# Patient Record
Sex: Male | Born: 1965 | Race: White | Hispanic: No | Smoking: Never smoker
Health system: Southern US, Community
[De-identification: ages and names within clinical notes are randomized; demographics above are authoritative.]

## PROBLEM LIST (undated history)

## (undated) DIAGNOSIS — E119 Type 2 diabetes mellitus without complications: Secondary | ICD-10-CM

## (undated) DIAGNOSIS — I671 Cerebral aneurysm, nonruptured: Secondary | ICD-10-CM

## (undated) DIAGNOSIS — G43909 Migraine, unspecified, not intractable, without status migrainosus: Secondary | ICD-10-CM

## (undated) DIAGNOSIS — R569 Unspecified convulsions: Secondary | ICD-10-CM

## (undated) DIAGNOSIS — I10 Essential (primary) hypertension: Secondary | ICD-10-CM

## (undated) HISTORY — PX: PANCREATIC PSEUDOCYST DRAINAGE: SHX2158

## (undated) HISTORY — PX: CHOLECYSTECTOMY: SHX55

## (undated) HISTORY — PX: BRAIN SURGERY: SHX531

---

## 1993-02-05 DIAGNOSIS — I671 Cerebral aneurysm, nonruptured: Secondary | ICD-10-CM

## 1993-02-05 HISTORY — DX: Cerebral aneurysm, nonruptured: I67.1

## 2008-04-01 ENCOUNTER — Ambulatory Visit: Payer: Self-pay | Admitting: Unknown Physician Specialty

## 2008-05-20 ENCOUNTER — Ambulatory Visit: Payer: Self-pay | Admitting: Unknown Physician Specialty

## 2009-09-27 ENCOUNTER — Emergency Department: Payer: Self-pay | Admitting: Internal Medicine

## 2009-10-23 ENCOUNTER — Emergency Department: Payer: Self-pay | Admitting: Unknown Physician Specialty

## 2010-03-31 ENCOUNTER — Ambulatory Visit: Payer: Self-pay | Admitting: Unknown Physician Specialty

## 2012-06-11 ENCOUNTER — Emergency Department: Payer: Self-pay | Admitting: Unknown Physician Specialty

## 2012-07-17 ENCOUNTER — Ambulatory Visit: Payer: Self-pay | Admitting: Physical Medicine and Rehabilitation

## 2013-09-09 ENCOUNTER — Emergency Department: Payer: Self-pay | Admitting: Emergency Medicine

## 2013-09-09 LAB — CBC
HCT: 43.9 % (ref 40.0–52.0)
HGB: 14.5 g/dL (ref 13.0–18.0)
MCH: 28.7 pg (ref 26.0–34.0)
MCHC: 33.1 g/dL (ref 32.0–36.0)
MCV: 87 fL (ref 80–100)
PLATELETS: 234 10*3/uL (ref 150–440)
RBC: 5.05 10*6/uL (ref 4.40–5.90)
RDW: 13.4 % (ref 11.5–14.5)
WBC: 7.7 10*3/uL (ref 3.8–10.6)

## 2013-09-09 LAB — URINALYSIS, COMPLETE
BILIRUBIN, UR: NEGATIVE
Bacteria: NONE SEEN
Blood: NEGATIVE
Ketone: NEGATIVE
Leukocyte Esterase: NEGATIVE
Nitrite: NEGATIVE
PH: 6 (ref 4.5–8.0)
Protein: NEGATIVE
RBC,UR: NONE SEEN /HPF (ref 0–5)
Specific Gravity: 1.038 (ref 1.003–1.030)
Squamous Epithelial: NONE SEEN
WBC UR: <1 /HPF (ref 0–5)

## 2013-09-09 LAB — COMPREHENSIVE METABOLIC PANEL
ALBUMIN: 3.8 g/dL (ref 3.4–5.0)
ALT: 37 U/L
Alkaline Phosphatase: 65 U/L
Anion Gap: 8 (ref 7–16)
BILIRUBIN TOTAL: 1.2 mg/dL — AB (ref 0.2–1.0)
BUN: 15 mg/dL (ref 7–18)
Calcium, Total: 9.2 mg/dL (ref 8.5–10.1)
Chloride: 99 mmol/L (ref 98–107)
Co2: 29 mmol/L (ref 21–32)
Creatinine: 0.73 mg/dL (ref 0.60–1.30)
EGFR (Non-African Amer.): >60
Glucose: 440 mg/dL — ABNORMAL HIGH (ref 65–99)
Osmolality: 292 (ref 275–301)
Potassium: 4.2 mmol/L (ref 3.5–5.1)
SGOT(AST): 19 U/L (ref 15–37)
Sodium: 136 mmol/L (ref 136–145)
Total Protein: 6.6 g/dL (ref 6.4–8.2)

## 2013-09-09 LAB — TROPONIN I: Troponin-I: <0.02 ng/mL

## 2013-10-06 ENCOUNTER — Ambulatory Visit: Payer: Self-pay | Admitting: Nurse Practitioner

## 2013-10-20 ENCOUNTER — Emergency Department: Payer: Self-pay | Admitting: Emergency Medicine

## 2013-11-05 ENCOUNTER — Ambulatory Visit: Payer: Self-pay | Admitting: Nurse Practitioner

## 2013-11-08 ENCOUNTER — Emergency Department: Payer: Self-pay | Admitting: Internal Medicine

## 2014-11-26 ENCOUNTER — Emergency Department: Payer: Medicare Other

## 2014-11-26 ENCOUNTER — Encounter: Payer: Self-pay | Admitting: Emergency Medicine

## 2014-11-26 ENCOUNTER — Emergency Department
Admission: EM | Admit: 2014-11-26 | Discharge: 2014-11-26 | Disposition: A | Discharge status: Home / Self Care | Payer: Medicare Other | Attending: Emergency Medicine | Admitting: Emergency Medicine

## 2014-11-26 DIAGNOSIS — Y9301 Activity, walking, marching and hiking: Secondary | ICD-10-CM | POA: Diagnosis not present

## 2014-11-26 DIAGNOSIS — S300XXA Contusion of lower back and pelvis, initial encounter: Secondary | ICD-10-CM | POA: Diagnosis not present

## 2014-11-26 DIAGNOSIS — E119 Type 2 diabetes mellitus without complications: Secondary | ICD-10-CM | POA: Diagnosis not present

## 2014-11-26 DIAGNOSIS — Y998 Other external cause status: Secondary | ICD-10-CM | POA: Diagnosis not present

## 2014-11-26 DIAGNOSIS — S6991XA Unspecified injury of right wrist, hand and finger(s), initial encounter: Secondary | ICD-10-CM | POA: Diagnosis present

## 2014-11-26 DIAGNOSIS — W19XXXA Unspecified fall, initial encounter: Secondary | ICD-10-CM

## 2014-11-26 DIAGNOSIS — W01198A Fall on same level from slipping, tripping and stumbling with subsequent striking against other object, initial encounter: Secondary | ICD-10-CM | POA: Insufficient documentation

## 2014-11-26 DIAGNOSIS — Y92009 Unspecified place in unspecified non-institutional (private) residence as the place of occurrence of the external cause: Secondary | ICD-10-CM | POA: Insufficient documentation

## 2014-11-26 DIAGNOSIS — S60221A Contusion of right hand, initial encounter: Secondary | ICD-10-CM | POA: Insufficient documentation

## 2014-11-26 HISTORY — DX: Type 2 diabetes mellitus without complications: E11.9

## 2014-11-26 NOTE — ED Provider Notes (Signed)
[facility] Emergency Department Provider Note ____________________________________________  Time seen: 1549  I have reviewed the triage vital signs and the nursing notes.  HISTORY  Chief Complaint  Fall  HPI Charles Bryan is a 49 y.o. male reports to the ED for evaluation of injury sustained after fall at home today. He describes he was walking down the driveway, when he accidentally tripped, landing on his right side with outstretched right hand. He complains of pain to the right gluteal region and in the palm of his right hand. He denies any other injury related to his fall.He rates his pain at a 7/10 in triage.  Past Medical History  Diagnosis Date  . Diabetes mellitus without complication (HCC)     There are no active problems to display for this patient.   History reviewed. No pertinent past surgical history.  No current outpatient prescriptions on file.  Allergies Review of patient's allergies indicates no known allergies.  No family history on file.  Social History Social History  Substance Use Topics  . Smoking status: Never Smoker   . Smokeless tobacco: None  . Alcohol Use: No   Review of Systems  Constitutional: Negative for fever. Eyes: Negative for visual changes. ENT: Negative for sore throat. Cardiovascular: Negative for chest pain. Respiratory: Negative for shortness of breath. Gastrointestinal: Negative for abdominal pain, vomiting and diarrhea. Genitourinary: Negative for dysuria. Musculoskeletal: Negative for back pain. Right palm pain. Right buttock pain. Skin: Negative for rash. Neurological: Negative for headaches, focal weakness or numbness. ____________________________________________  PHYSICAL EXAM:  VITAL SIGNS: ED Triage Vitals  Enc Vitals Group     BP 11/26/14 1454 149/81 mmHg     Pulse Rate 11/26/14 1454 59     Resp 11/26/14 1454 18     Temp 11/26/14 1454 97.5 F (36.4 C)     Temp Source 11/26/14  1454 Oral     SpO2 11/26/14 1454 98 %     Weight 11/26/14 1454 185 lb (83.915 kg)     Height 11/26/14 1454  (1.854 m)     Head Cir --      Peak Flow --      Pain Score 11/26/14 1454 8     Pain Loc --      Pain Edu? --      Excl. in GC? --    Constitutional: Alert and oriented. Well appearing and in no distress. Head: Normocephalic and atraumatic.      Eyes: Conjunctivae are normal. PERRL. Normal extraocular movements      Ears: Canals clear. TMs intact bilaterally.   Nose: No congestion/rhinorrhea.   Mouth/Throat: Mucous membranes are moist.   Neck: Supple. No thyromegaly. Hematological/Lymphatic/Immunological: No cervical lymphadenopathy. Cardiovascular: Normal rate, regular rhythm.  Respiratory: Normal respiratory effort. No wheezes/rales/rhonchi. Gastrointestinal: Soft and nontender. No distention. Musculoskeletal:  Right hand without any obvious deformity, abrasion, or swelling. He is mainly tender to palpation over the thenar prominence in the palm. Otherwise noted to have a normal composite fist on exam. His lumbar spine is in normal alignment without midline tenderness, spasm, or step-off. He is noted to have normal full range of motion lumbar spine. The right hip also ranges normally with flexion and extension as well as internal and external rotation. Patient is oriented to palpation over the right gluteal muscular region.Nontender with normal range of motion in all extremities.  Neurologic:  Normal gait without ataxia. Normal speech and language. No gross focal neurologic deficits are appreciated. Skin:  Skin is warm, dry and intact. No rash noted. Psychiatric: Mood and affect are normal. Patient exhibits appropriate insight and judgment. ____________________________________________   RADIOLOGY Right Hand IMPRESSION: No acute osseous injury of the right hand.  I, Nylan Nakatani, Charlesetta IvoryJenise V Bacon, personally viewed and evaluated these images (plain radiographs) as part  of my medical decision making.  ____________________________________________  INITIAL IMPRESSION / ASSESSMENT AND PLAN / ED COURSE  Normal radiology exam and normal physical exam following a ground-level fall today. No acute injury sustained to the palm or the hip. Patient will be discharged with instructions on hand contusion and buttock contusion management. He was also over-the-counter Advil or Aleve as needed for pain relief. Follow up with his primary care provider for ongoing symptoms. ____________________________________________  FINAL CLINICAL IMPRESSION(S) / ED DIAGNOSES  Final diagnoses:  Fall, initial encounter  Hand contusion, right, initial encounter  Contusion, buttock, initial encounter      Lissa HoardJenise V Bacon Grant Swager, PA-C 11/26/14 1724  Phineas SemenGraydon Goodman, MD 11/26/14 1749

## 2014-11-26 NOTE — Discharge Instructions (Signed)
Hand Contusion  A hand contusion is a deep bruise to the hand. Contusions happen when an injury causes bleeding under the skin. Signs of bruising include pain, puffiness (swelling), and discolored skin. The contusion may turn blue, purple, or yellow. HOME CARE  Put ice on the injured area.  Put ice in a plastic bag.  Place a towel between your skin and the bag.  Leave the ice on for 15-20 minutes, 03-04 times a day.  Only take medicines as told by your doctor.  Use an elastic wrap only as told. You may remove the wrap for sleeping, showering, and bathing. Take the wrap off if you lose feeling (have numbness) in your fingers, or they turn blue or cold. Put the wrap on more loosely.  Keep the hand raised (elevated) with pillows.  Avoid using your hand too much if it is painful. GET HELP RIGHT AWAY IF:   You have more redness, puffiness, or pain in your hand.  Your puffiness or pain does not get better with medicine.  You lose feeling in your hand, or you cannot move your fingers.  Your hand turns cold or blue.  You have pain when you move your fingers.  Your hand feels warm.  Your contusion does not get better in 2 days. MAKE SURE YOU:   Understand these instructions.  Will watch this condition.  Will get help right away if you are not doing well or you get worse.   This information is not intended to replace advice given to you by your health care provider. Make sure you discuss any questions you have with your health care provider.   Document Released: 07/11/2007 Document Revised: 02/12/2014 Document Reviewed: 07/16/2011 Elsevier Interactive Patient Education 2016 Else[employer] Inc.  Iliac Crest Contusion  An iliac crest contusion is a deep bruise of your hip bone (hip pointer). Contusions happen when an injury causes bleeding under the skin. Signs of bruising include pain, puffiness (swelling), and discolored skin. The contusion may turn blue, purple, or yellow. HOME  CARE   Put ice on the injured area.  Put ice in a plastic bag.  Place a towel between your skin and the bag.  Leave the ice on for 15-20 minutes, 03-04 times a day.  Only take medicines as told by your doctor.  Keep your leg straight (extended) when possible.  Walk and move around as pain allows, or as told by your doctor. Use crutches if you are told to do so.  Put on an elastic wrap as told by your doctor. You can take it off for sleeping, showers, and baths. GET HELP RIGHT AWAY IF:  You have more bruising or puffiness.  You have pain that is getting worse.  Your puffiness or pain is not helped by medicines.  Your toes get cold. MAKE SURE YOU:   Understand these instructions.  Will watch your condition.  Will get help right away if you are not doing well or get worse.   This information is not intended to replace advice given to you by your health care provider. Make sure you discuss any questions you have with your health care provider.   Document Released: 01/11/2011 Document Revised: 07/24/2011 Document Reviewed: 06/09/2014 Elsevier Interactive Patient Education [employer]  Your exam and x-ray are normal today following your fall. Take OTC Aleve or Advil as needed for pain relief.  Apply ice to reduce pain and swelling.

## 2014-11-26 NOTE — ED Notes (Signed)
Pt to ED with c/o fall, states he tripped and fell and landed on right hip, c/o right hip pain and right hand pain

## 2014-11-30 ENCOUNTER — Encounter: Payer: Self-pay | Admitting: Emergency Medicine

## 2014-11-30 ENCOUNTER — Emergency Department
Admission: EM | Admit: 2014-11-30 | Discharge: 2014-11-30 | Disposition: A | Discharge status: Home / Self Care | Payer: Medicare Other | Attending: Student | Admitting: Student

## 2014-11-30 DIAGNOSIS — R200 Anesthesia of skin: Secondary | ICD-10-CM | POA: Diagnosis not present

## 2014-11-30 DIAGNOSIS — E1165 Type 2 diabetes mellitus with hyperglycemia: Secondary | ICD-10-CM | POA: Diagnosis not present

## 2014-11-30 DIAGNOSIS — E119 Type 2 diabetes mellitus without complications: Secondary | ICD-10-CM

## 2014-11-30 LAB — GLUCOSE, CAPILLARY: Glucose-Capillary: 168 mg/dL — ABNORMAL HIGH (ref 65–99)

## 2014-11-30 NOTE — ED Notes (Signed)
Pt to ed with c/o elevated blood sugar today,  States it was approx 500, states last 2 months increasing.

## 2014-11-30 NOTE — Discharge Instructions (Signed)
Follow-up with her primary care doctor in 1-2 days for reevaluation. Your blood pressure is a little bit elevated here in the emergency department. You need to have this rechecked by your primary care doctor as well within the next week. Return to the emergency department if you develop any chest pain, difficulty breathing, sudden sweating, or numbness, new weakness, or for any other concerns.

## 2014-11-30 NOTE — ED Provider Notes (Signed)
[facility] Emergency Department Provider Note  ____________________________________________  Time seen: Approximately 7:35 AM  I have reviewed the triage vital signs and the nursing notes.   HISTORY  Chief Complaint Hyperglycemia    HPI Charles Bryan is a 49 y.o. 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 Patient reports that yesterday he took his blood sugar and it was normal. Over the past 1-3 months that has been good range averaging under 150. Today when he checked his blood sugar it was greater than 500 on his home meter. He has had no chest pain, difficulty breathing, recent illness including no cough, sneezing, runny nose, congestion, vomiting, diarrhea, fevers or chills. No chest pain or difficulty breathing. No recent change in his medications. He has otherwise been in his usual state of health. No modifying factors. Currently he has no symptoms. He reports compliance with all of his medications.   Past Medical History  Diagnosis Date  . Diabetes mellitus without complication (HCC)     There are no active problems to display for this patient.   History reviewed. No pertinent past surgical history.  No current outpatient prescriptions on file.  Allergies Review of patient's allergies indicates no known allergies.  History reviewed. No pertinent family history.  Social History Social History  Substance Use Topics  . Smoking status: Never Smoker   . Smokeless tobacco: None  . Alcohol Use: No    Review of Systems Constitutional: No fever/chills Eyes: No visual changes. ENT: No sore throat. Cardiovascular: Denies chest pain. Respiratory: Denies shortness of breath. Gastrointestinal: No abdominal pain.  No nausea, no vomiting.  No diarrhea.  No  constipation. Genitourinary: Negative for dysuria. Musculoskeletal: Negative for back pain. Skin: Negative for rash. Neurological: Negative for headaches, positive for chronic LEU and LLE focal weakness and numbness.  10-point ROS otherwise negative.  ____________________________________________   PHYSICAL EXAM:  VITAL SIGNS: ED Triage Vitals  Enc Vitals Group     BP 11/30/14 0728 171/92 mmHg     Pulse Rate 11/30/14 0728 59     Resp 11/30/14 0728 20     Temp --      Temp Source 11/30/14 0728 Oral     SpO2 11/30/14 0728 98 %     Weight 11/30/14 0728 185 lb (83.915 kg)     Height 11/30/14 0728 6\' 1"  (1.854 m)     Head Cir --      Peak Flow --      Pain Score 11/30/14 0729 0     Pain Loc --      Pain Edu? --      Excl. in GC? --     Constitutional: Alert and oriented. Well appearing and in no acute distress. Eyes: Conjunctivae are normal. PERRL. EOMI. Head: Atraumatic. Nose: No congestion/rhinnorhea. Mouth/Throat: Mucous membranes are moist.  Oropharynx non-erythematous. Neck: No stridor.   Cardiovascular: Normal rate, regular rhythm. Grossly normal heart sounds.  Good peripheral circulation. Respiratory: Normal respiratory effort.  No retractions. Lungs CTAB. Gastrointestinal: Soft and nontender. No distention. No abdominal bruits. No CVA tenderness. Genitourinary: deferred Musculoskeletal: No lower extremity tenderness nor edema.  No joint effusions. Neurologic:  Normal speech and language. Chronic mild weakness in the left upper extremity and left lower extremity which the patient reports is chronic for the past 20  years. Skin:  Skin is warm, dry and intact. No rash noted. Psychiatric: Mood and affect are normal. Speech and behavior are normal.  ____________________________________________   LABS (all labs ordered are listed, but only abnormal results are displayed)  Labs Reviewed  GLUCOSE, CAPILLARY - Abnormal; Notable for the following:    Glucose-Capillary 168  (*)    All other components within normal limits  CBG MONITORING, ED   ____________________________________________  EKG  none ____________________________________________  RADIOLOGY  none ____________________________________________   PROCEDURES  Procedure(s) performed: None  Critical Care performed: No  ____________________________________________   INITIAL IMPRESSION / ASSESSMENT AND PLAN / ED COURSE  Pertinent labs & imaging results that were available during my care of the patient were reviewed by me and considered in my medical decision making (see chart for details).  Charles Bryan is a 49 y.o. 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 He is asymptomatic. On exam he is very well-appearing and in no acute distress. Vital signs stable with the exception of mild to moderate hypertension. Glucose is 168. Patient reports that he thinks his meter is running low on battery. I have encouraged him to report to his pharmacy so that it can be checked/calibrated or replaced. Additionally he will follow up with his primary care doctor in the next 2-3 days for re-check and for Blood pressure re-check. We discussed return precautions and he is comfortable with the discharge plan. DC home. ____________________________________________   FINAL CLINICAL IMPRESSION(S) / ED DIAGNOSES  Final diagnoses:  Type 2 diabetes mellitus without complication, without long-term current use of insulin (HCC)      Gayla Doss, MD 11/30/14 [phone]

## 2015-10-26 ENCOUNTER — Emergency Department
Admission: EM | Admit: 2015-10-26 | Discharge: 2015-10-26 | Disposition: A | Discharge status: Home / Self Care | Payer: Medicare Other | Attending: Emergency Medicine | Admitting: Emergency Medicine

## 2015-10-26 ENCOUNTER — Encounter: Payer: Self-pay | Admitting: Emergency Medicine

## 2015-10-26 ENCOUNTER — Emergency Department: Payer: Medicare Other

## 2015-10-26 DIAGNOSIS — Z23 Encounter for immunization: Secondary | ICD-10-CM | POA: Insufficient documentation

## 2015-10-26 DIAGNOSIS — Z7982 Long term (current) use of aspirin: Secondary | ICD-10-CM | POA: Insufficient documentation

## 2015-10-26 DIAGNOSIS — E119 Type 2 diabetes mellitus without complications: Secondary | ICD-10-CM | POA: Insufficient documentation

## 2015-10-26 DIAGNOSIS — Y999 Unspecified external cause status: Secondary | ICD-10-CM | POA: Diagnosis not present

## 2015-10-26 DIAGNOSIS — S91122A Laceration with foreign body of left great toe without damage to nail, initial encounter: Secondary | ICD-10-CM | POA: Diagnosis present

## 2015-10-26 DIAGNOSIS — Z79899 Other long term (current) drug therapy: Secondary | ICD-10-CM | POA: Insufficient documentation

## 2015-10-26 DIAGNOSIS — Y929 Unspecified place or not applicable: Secondary | ICD-10-CM | POA: Insufficient documentation

## 2015-10-26 DIAGNOSIS — Y9389 Activity, other specified: Secondary | ICD-10-CM | POA: Diagnosis not present

## 2015-10-26 DIAGNOSIS — W25XXXA Contact with sharp glass, initial encounter: Secondary | ICD-10-CM | POA: Diagnosis not present

## 2015-10-26 DIAGNOSIS — S90452A Superficial foreign body, left great toe, initial encounter: Secondary | ICD-10-CM | POA: Diagnosis not present

## 2015-10-26 DIAGNOSIS — Z7984 Long term (current) use of oral hypoglycemic drugs: Secondary | ICD-10-CM | POA: Insufficient documentation

## 2015-10-26 MED ORDER — CEPHALEXIN 500 MG PO CAPS: 500.0000 mg | ORAL_CAPSULE | Freq: Three times a day (TID) | ORAL | 0 refills | Status: DC

## 2015-10-26 MED ORDER — TETANUS-DIPHTH-ACELL PERTUSSIS 5-2.5-18.5 LF-MCG/0.5 IM SUSP
0.5000 mL | Freq: Once | INTRAMUSCULAR | Status: AC
Start: 2015-10-26 — End: 2015-10-26
  Administered 2015-10-26: 0.5 mL via INTRAMUSCULAR
  Filled 2015-10-26: qty 0.5

## 2015-10-26 NOTE — ED Notes (Addendum)
Pt states he got glass in his left foot on Sunday and wants to be evaluated due to being a diabetic, pt awake and alert, ambulatory

## 2015-10-26 NOTE — ED Provider Notes (Signed)
[facility] Emergency Department Provider Note  ____________________________________________  Time seen: Approximately 2:12 PM  I have reviewed the triage vital signs and the nursing notes.   HISTORY  Chief Complaint Toe Pain    HPI Charles Bryan is a 50 y.o. male, NAD, presents to the emergency department with 3 day history of pain and laceration of the left great toe.  Patient reports that the cut appeared on Sunday and he believes he may have stepped on a shard of glass. Patient is unsure if the glass is still in the toe.  He states that he has been cleaning the wound well and applying Neosporin and bandages.  He endorses pain of the plantar surface of the left great toe.  He also reports a soreness of the left medial ankle and medial ball of left foot that began 2 days ago, but believes this may be due to planting his left foot more medially when walking to minimize pain about the toe.  He expresses concern for possible infection as he is diabetic and history of chronic decreased sensation of the left side of his body due to remote aneurism. Patient states that his most recent A1C was 6.1% and that his glucose typically ranges between 100 and 130.  Patient denies redness, swelling or drainage at the wound.  He denies loss of ROM of left toes and ankle.  Patient denies headache, fever, chills, abdominal pain, chest pain, shortness of breath, nausea, or vomiting.    Past Medical History:  Diagnosis Date  . Diabetes mellitus without complication (HCC)     There are no active problems to display for this patient.   History reviewed. No pertinent surgical history.  Prior to Admission medications   Medication Sig Start Date End Date Taking? Authorizing Provider  aspirin EC 81 MG tablet Take 81 mg by mouth daily.    Historical Provider, MD  cephALEXin (KEFLEX) 500 MG capsule Take 1 capsule (500 mg total) by mouth 3 (three) times daily. 10/26/15   Daylan Juhnke L Kaula Klenke,  PA-C  gabapentin (NEURONTIN) 800 MG tablet Take 800 mg by mouth 4 (four) times daily.    Historical Provider, MD  Lacosamide 150 MG TABS Take 150 mg by mouth 2 (two) times daily.    Historical Provider, MD  levETIRAcetam (KEPPRA XR) 500 MG 24 hr tablet Take 500 mg by mouth 2 (two) times daily.    Historical Provider, MD  metFORMIN (GLUCOPHAGE) 500 MG tablet Take 1,000 mg by mouth 2 (two) times daily with a meal.    Historical Provider, MD  Multiple Vitamin (MULTI-VITAMINS) TABS Take 1 tablet by mouth daily.    Historical Provider, MD  propranolol (INDERAL) 80 MG tablet Take 80 mg by mouth 2 (two) times daily.    Historical Provider, MD  sitaGLIPtin (JANUVIA) 100 MG tablet Take 100 mg by mouth daily.    Historical Provider, MD  SUMAtriptan (IMITREX) 100 MG tablet Take 100 mg by mouth every 2 (two) hours as needed for migraine.     Historical Provider, MD  tiZANidine (ZANAFLEX) 4 MG tablet Take 2-8 mg by mouth See admin instructions. 2 mg every morning and 8 mg at bedtime    Historical Provider, MD  zolpidem (AMBIEN) 10 MG tablet Take 10 mg by mouth at bedtime as needed for sleep.     Historical Provider, MD    Allergies Review of patient's allergies indicates no known allergies.  No family history on file.  Social History Social  History  Substance Use Topics  . Smoking status: Never Smoker  . Smokeless tobacco: Never Used  . Alcohol use No     Review of Systems  Constitutional: No fever/chills Cardiovascular: No chest pain. Respiratory: No shortness of breath.  Gastrointestinal: No abdominal pain.  No nausea, vomiting.   Musculoskeletal: Positive left great toe, foot and ankle pain. No lower leg pain.  Skin: Positive laceration to left toe, Negative for rash, redness, swelling, oozing, weeping.   Neurological: Negative for headaches. Chronic loss of sensation of left side of body 10-point ROS otherwise negative.  ____________________________________________   PHYSICAL  EXAM:  VITAL SIGNS: ED Triage Vitals  Enc Vitals Group     BP 10/26/15 1145 (!) 153/73     Pulse Rate 10/26/15 1145 67     Resp 10/26/15 1145 18     Temp 10/26/15 1145 98 F (36.7 C)     Temp Source 10/26/15 1145 Oral     SpO2 10/26/15 1145 97 %     Weight 10/26/15 1146 190 lb (86.2 kg)     Height 10/26/15 1146 6\' 1"  (1.854 m)     Head Circumference --      Peak Flow --      Pain Score 10/26/15 1146 5     Pain Loc --      Pain Edu? --      Excl. in GC? --      Constitutional: Alert and oriented. Well appearing and in no acute distress. Eyes: Conjunctivae are normal.  Head: Atraumatic. Cardiovascular: Good peripheral circulation with 2+ pulses in the left lower extremity. Capillary refill is brisk in all digits of the left foot.  Respiratory: Normal respiratory effort without tachypnea or retractions.  Musculoskeletal: Tenderness on palpation of plantar surface of left great toe at the laceration site. Full range of motion of all toes on the left foot. Full range of motion of the left ankle without pain or difficulty. No laxity with anterior or posterior drawer of the left ankle. No lower extremity pain or edema. No joint effusions. Neurologic:  Normal speech and language. No gross focal neurologic deficits are appreciated.  Skin:  Skin is warm, dry. No rash noted. 2mm superficial laceration on plantar surface of left great toe with mild surrounding erythema.  No oozing or weeping, no bleeding.  No foreign body palpated or visualized. Psychiatric: Mood and affect are normal. Speech and behavior are normal. Patient exhibits appropriate insight and judgement.   ____________________________________________   LABS  None ____________________________________________  EKG  None ____________________________________________  RADIOLOGY I have personally viewed and evaluated these images (plain radiographs) as part of my medical decision making, as well as reviewing the written  report by the radiologist.  Dg Toe Great Left  Result Date: 10/26/2015 CLINICAL DATA:  Stepped on broken glass, pain, diabetes EXAM: LEFT GREAT TOE COMPARISON:  None available FINDINGS: On the lateral view, there is a small radiopaque rectangular foreign body within the plantar soft tissues of the great toe compatible with a glass foreign body. This measures 2 mm. Mild soft tissue swelling. No acute osseous finding or malalignment.  Negative for fracture. IMPRESSION: Small 2 mm radiopaque foreign body in the great toe plantar soft tissues. Electronically Signed   By: Judie PetitM.  Shick M.D.   On: 10/26/2015 13:20    ____________________________________________    PROCEDURES  Procedure(s) performed: None   .Foreign Body Removal Date/Time: 10/26/2015 2:13 PM Performed by: Tye SavoyHAGLER, Fatime Biswell L Authorized by: Hope PigeonHAGLER, Adonis Ryther L  Consent: Verbal consent obtained. Risks and benefits: risks, benefits and alternatives were discussed Consent given by: patient Patient understanding: patient states understanding of the procedure being performed Imaging studies: imaging studies available Patient identity confirmed: verbally with patient and arm band Time out: Immediately prior to procedure a "time out" was called to verify the correct patient, procedure, equipment, support staff and site/side marked as required. Body area: skin General location: lower extremity Location details: left big toe Anesthesia: local infiltration  Anesthesia: Local Anesthetic: lidocaine 1% with epinephrine Anesthetic total: 2 mL  Sedation: Patient sedated: no Patient restrained: no Patient cooperative: yes Localization method: probed and serial x-rays Removal mechanism: forceps, scalpel and irrigation Dressing: dressing applied Tendon involvement: none Depth: subcutaneous Complexity: simple 1 objects recovered. Objects recovered: 3mm glass shard Post-procedure assessment: foreign body removed Patient tolerance: Patient  tolerated the procedure well with no immediate complications     Medications  Tdap (BOOSTRIX) injection 0.5 mL (0.5 mLs Intramuscular Given 10/26/15 1420)     ____________________________________________   INITIAL IMPRESSION / ASSESSMENT AND PLAN / ED COURSE  Pertinent labs & imaging results that were available during my care of the patient were reviewed by me and considered in my medical decision making (see chart for details).  Clinical Course    Patient's diagnosis is consistent with superficial foreign body of the left great toe that was subsequently removed. Patient will be discharged home with prescriptions for Keflex to take as directed. Patient is to keep the area clean and dry as well as covered until fully healed. Patient is to follow up with his primary care provider 48 hours for wound recheck. Patient is given ED precautions to return to the ED for any worsening or new symptoms.    ____________________________________________  FINAL CLINICAL IMPRESSION(S) / ED DIAGNOSES  Final diagnoses:  Superficial foreign body of left great toe, initial encounter      NEW MEDICATIONS STARTED DURING THIS VISIT:  Discharge Medication List as of 10/26/2015  2:15 PM    START taking these medications   Details  cephALEXin (KEFLEX) 500 MG capsule Take 1 capsule (500 mg total) by mouth 3 (three) times daily., Starting Wed 10/26/2015, Print             Ernestene Kiel [place], PA-C 10/26/15 1719    Jennye Moccasin, MD 10/27/15 [phone]

## 2015-10-26 NOTE — ED Triage Notes (Signed)
Pt reports piece of glass on left foot great toe since Sunday. Pain to that area

## 2015-10-26 NOTE — ED Notes (Signed)
Pt informed to return if any life threatening symptoms occur.  

## 2016-03-29 ENCOUNTER — Emergency Department
Admission: EM | Admit: 2016-03-29 | Discharge: 2016-03-29 | Disposition: A | Discharge status: Home / Self Care | Payer: Medicare PPO | Attending: Emergency Medicine | Admitting: Emergency Medicine

## 2016-03-29 ENCOUNTER — Emergency Department: Payer: Medicare PPO

## 2016-03-29 ENCOUNTER — Encounter: Payer: Self-pay | Admitting: Emergency Medicine

## 2016-03-29 DIAGNOSIS — Z79899 Other long term (current) drug therapy: Secondary | ICD-10-CM | POA: Insufficient documentation

## 2016-03-29 DIAGNOSIS — S92911A Unspecified fracture of right toe(s), initial encounter for closed fracture: Secondary | ICD-10-CM

## 2016-03-29 DIAGNOSIS — Z7984 Long term (current) use of oral hypoglycemic drugs: Secondary | ICD-10-CM | POA: Diagnosis not present

## 2016-03-29 DIAGNOSIS — Y999 Unspecified external cause status: Secondary | ICD-10-CM | POA: Insufficient documentation

## 2016-03-29 DIAGNOSIS — Y939 Activity, unspecified: Secondary | ICD-10-CM | POA: Diagnosis not present

## 2016-03-29 DIAGNOSIS — Y929 Unspecified place or not applicable: Secondary | ICD-10-CM | POA: Insufficient documentation

## 2016-03-29 DIAGNOSIS — S99921A Unspecified injury of right foot, initial encounter: Secondary | ICD-10-CM | POA: Diagnosis present

## 2016-03-29 DIAGNOSIS — Z7982 Long term (current) use of aspirin: Secondary | ICD-10-CM | POA: Insufficient documentation

## 2016-03-29 DIAGNOSIS — W208XXA Other cause of strike by thrown, projected or falling object, initial encounter: Secondary | ICD-10-CM | POA: Insufficient documentation

## 2016-03-29 DIAGNOSIS — E119 Type 2 diabetes mellitus without complications: Secondary | ICD-10-CM | POA: Insufficient documentation

## 2016-03-29 DIAGNOSIS — S92534A Nondisplaced fracture of distal phalanx of right lesser toe(s), initial encounter for closed fracture: Secondary | ICD-10-CM | POA: Diagnosis not present

## 2016-03-29 NOTE — ED Provider Notes (Signed)
[facility] Emergency Department Provider Note  ____________________________________________  Time seen: Approximately 2:06 PM  I have reviewed the triage vital signs and the nursing notes.   HISTORY  Chief Complaint Toe Injury    HPI Charles Bryan is a 51 y.o. male presents to emergency department after dropping a 20oz drink onto 4th toe.  Patient states that toe has been swelling and painful to walk on. Patient denies any other injuries. Patient has not taken anything for pain. No fever, SOB, CP, nausea, vomiting, abdominal pain.    Past Medical History:  Diagnosis Date  . Diabetes mellitus without complication (HCC)     There are no active problems to display for this patient.   History reviewed. No pertinent surgical history.  Prior to Admission medications   Medication Sig Start Date End Date Taking? Authorizing Provider  aspirin EC 81 MG tablet Take 81 mg by mouth daily.    Historical Provider, MD  cephALEXin (KEFLEX) 500 MG capsule Take 1 capsule (500 mg total) by mouth 3 (three) times daily. 10/26/15   Jami L Hagler, PA-C  gabapentin (NEURONTIN) 800 MG tablet Take 800 mg by mouth 4 (four) times daily.    Historical Provider, MD  Lacosamide 150 MG TABS Take 150 mg by mouth 2 (two) times daily.    Historical Provider, MD  levETIRAcetam (KEPPRA XR) 500 MG 24 hr tablet Take 500 mg by mouth 2 (two) times daily.    Historical Provider, MD  metFORMIN (GLUCOPHAGE) 500 MG tablet Take 1,000 mg by mouth 2 (two) times daily with a meal.    Historical Provider, MD  Multiple Vitamin (MULTI-VITAMINS) TABS Take 1 tablet by mouth daily.    Historical Provider, MD  propranolol (INDERAL) 80 MG tablet Take 80 mg by mouth 2 (two) times daily.    Historical Provider, MD  sitaGLIPtin (JANUVIA) 100 MG tablet Take 100 mg by mouth daily.    Historical Provider, MD  SUMAtriptan (IMITREX) 100 MG tablet Take 100 mg by mouth every 2 (two) hours as needed for migraine.      Historical Provider, MD  tiZANidine (ZANAFLEX) 4 MG tablet Take 2-8 mg by mouth See admin instructions. 2 mg every morning and 8 mg at bedtime    Historical Provider, MD  zolpidem (AMBIEN) 10 MG tablet Take 10 mg by mouth at bedtime as needed for sleep.     Historical Provider, MD    Allergies Patient has no known allergies.  No family history on file.  Social History Social History  Substance Use Topics  . Smoking status: Never Smoker  . Smokeless tobacco: Never Used  . Alcohol use No     Review of Systems  Constitutional: No fever/chills ENT: No upper respiratory complaints. Cardiovascular: No chest pain. Respiratory: No cough. No SOB. Gastrointestinal: No abdominal pain.  No nausea, no vomiting.  Skin: Negative for rash, abrasions, lacerations. Neurological: Negative for headaches, numbness or tingling   ____________________________________________   PHYSICAL EXAM:  VITAL SIGNS: ED Triage Vitals  Enc Vitals Group     BP 03/29/16 1323 (!) 156/97     Pulse Rate 03/29/16 1323 66     Resp 03/29/16 1323 18     Temp 03/29/16 1323 98.7 F (37.1 C)     Temp Source 03/29/16 1323 Oral     SpO2 03/29/16 1323 100 %     Weight 03/29/16 1324 195 lb (88.5 kg)     Height 03/29/16 1324 6\' 1"  (1.854 m)  Head Circumference --      Peak Flow --      Pain Score 03/29/16 1325 0     Pain Loc --      Pain Edu? --      Excl. in GC? --      Constitutional: Alert and oriented. Well appearing and in no acute distress. Eyes: Conjunctivae are normal. PERRL. EOMI. Head: Atraumatic. ENT:      Ears:      Nose: No congestion/rhinnorhea.      Mouth/Throat: Mucous membranes are moist.  Neck: No stridor.  Cardiovascular: Normal rate, regular rhythm.  Good peripheral circulation. 2+ dorsalis pedis and posterior tibialis pulses. Respiratory: Normal respiratory effort without tachypnea or retractions. Lungs CTAB. Good air entry to the bases with no decreased or absent breath  sounds. Musculoskeletal: Full range of motion to all extremities. No gross deformities appreciated.   Neurologic:  Normal speech and language. No gross focal neurologic deficits are appreciated.  Skin:  Skin is warm, dry and intact. Blood blister at distal 4th right toe. Bruising and mild swelling to distal toe.  Psychiatric: Mood and affect are normal. Speech and behavior are normal. Patient exhibits appropriate insight and judgement.   ____________________________________________   LABS (all labs ordered are listed, but only abnormal results are displayed)  Labs Reviewed - No data to display ____________________________________________  EKG   ____________________________________________  RADIOLOGY Lexine Baton, personally viewed and evaluated these images (plain radiographs) as part of my medical decision making, as well as reviewing the written report by the radiologist.  Dg Toe 4th Right  Result Date: 03/29/2016 CLINICAL DATA:  Right fourth toe injury, dropped 20 ounces drink on toe yesterday EXAM: RIGHT FOURTH TOE COMPARISON:  None. FINDINGS: Four views of the right fourth toe submitted. There is nondisplaced fracture at the tip of distal phalanx. Soft tissue swelling is noted. IMPRESSION: Nondisplaced fracture at the tip of distal phalanx fourth toe with adjacent soft tissue swelling. Electronically Signed   By: Natasha Mead M.D.   On: 03/29/2016 14:39    ____________________________________________    PROCEDURES  Procedure(s) performed:    Procedures  Blood was drained from blister as distal end of 4th toe  Medications - No data to display   ____________________________________________   INITIAL IMPRESSION / ASSESSMENT AND PLAN / ED COURSE  Pertinent labs & imaging results that were available during my care of the patient were reviewed by me and considered in my medical decision making (see chart for details).  Review of the [place] CSRS was performed in  accordance of the [facility] prior to dispensing any controlled drugs.     Patient's diagnosis is consistent with toe fracture and nail bed injury. Vital signs and exam are reassuring. Toe fracture indicated on x-ray. Toe was buddy taped. Blood was drained from blister at distal toe. Injury to nail bed is observed. Toenail will be left in place for new nail to grow under and I expect that old toenail will fall off.  Education was provided. All questions were answered. Patient is to follow up with PCP as directed. Patient is given ED precautions to return to the ED for any worsening or new symptoms.     ____________________________________________  FINAL CLINICAL IMPRESSION(S) / ED DIAGNOSES  Final diagnoses:  Closed nondisplaced fracture of phalanx of toe of right foot, unspecified toe, initial encounter      NEW MEDICATIONS STARTED DURING THIS VISIT:  Discharge Medication List as of 03/29/2016  3:20 PM  This chart was dictated using voice recognition software/Dragon. Despite best efforts to proofread, errors can occur which can change the meaning. Any change was purely unintentional.    Enid Derry, PA-C 03/29/16 1600    Sharman Cheek, MD 04/03/16 1110

## 2016-03-29 NOTE — ED Triage Notes (Signed)
States dropped a 20 oz bottle of soda on right fourth toe yesterday, today arrives c/o pain and blood blister to toe.

## 2016-03-29 NOTE — ED Notes (Signed)
Pt states dropped a 20 oz drink from the top of his counter onto his 4th toe yesterday. Pt presents with bruising and swelling under the nail and up to the distal joint from the foot. Pt ambulatory with no difficulty, c/o pain when toe is touched or moved.

## 2016-04-29 ENCOUNTER — Encounter: Payer: Self-pay | Admitting: Emergency Medicine

## 2016-04-29 ENCOUNTER — Emergency Department
Admission: EM | Admit: 2016-04-29 | Discharge: 2016-04-29 | Disposition: A | Discharge status: Home / Self Care | Payer: Medicare PPO | Attending: Emergency Medicine | Admitting: Emergency Medicine

## 2016-04-29 ENCOUNTER — Emergency Department: Payer: Medicare PPO

## 2016-04-29 DIAGNOSIS — E119 Type 2 diabetes mellitus without complications: Secondary | ICD-10-CM | POA: Insufficient documentation

## 2016-04-29 DIAGNOSIS — Z7982 Long term (current) use of aspirin: Secondary | ICD-10-CM | POA: Insufficient documentation

## 2016-04-29 DIAGNOSIS — Z7984 Long term (current) use of oral hypoglycemic drugs: Secondary | ICD-10-CM | POA: Diagnosis not present

## 2016-04-29 DIAGNOSIS — R0789 Other chest pain: Secondary | ICD-10-CM | POA: Insufficient documentation

## 2016-04-29 DIAGNOSIS — Z79899 Other long term (current) drug therapy: Secondary | ICD-10-CM | POA: Diagnosis not present

## 2016-04-29 DIAGNOSIS — R0781 Pleurodynia: Secondary | ICD-10-CM | POA: Diagnosis present

## 2016-04-29 MED ORDER — OXYCODONE-ACETAMINOPHEN 5-325 MG PO TABS
1.0000 | ORAL_TABLET | Freq: Once | ORAL | Status: AC
  Administered 2016-04-29: 1 via ORAL
  Filled 2016-04-29: qty 1

## 2016-04-29 MED ORDER — OXYCODONE-ACETAMINOPHEN 5-325 MG PO TABS: 1.0000 | ORAL_TABLET | Freq: Four times a day (QID) | ORAL | 0 refills | Status: DC | PRN

## 2016-04-29 NOTE — ED Provider Notes (Signed)
[facility] Emergency Department Provider Note   ____________________________________________   None    (approximate)  I have reviewed the triage vital signs and the nursing notes.   HISTORY  Chief Complaint Chest Pain    HPI Charles Bryan is a 51 y.o. male complaining of left rib cage pain status post slip and fall last night. Patient state increased pain with deep inspirations. Patient denies any head injury from this incident. Patient rates his pain as a 6/10. Patient described a pain as "stabbing/aching". No palliative measures for this complaint.   Past Medical History:  Diagnosis Date  . Diabetes mellitus without complication (HCC)     There are no active problems to display for this patient.   History reviewed. No pertinent surgical history.  Prior to Admission medications   Medication Sig Start Date End Date Taking? Authorizing Provider  aspirin EC 81 MG tablet Take 81 mg by mouth daily.    Historical Provider, MD  cephALEXin (KEFLEX) 500 MG capsule Take 1 capsule (500 mg total) by mouth 3 (three) times daily. 10/26/15   Jami L Hagler, PA-C  gabapentin (NEURONTIN) 800 MG tablet Take 800 mg by mouth 4 (four) times daily.    Historical Provider, MD  Lacosamide 150 MG TABS Take 150 mg by mouth 2 (two) times daily.    Historical Provider, MD  levETIRAcetam (KEPPRA XR) 500 MG 24 hr tablet Take 500 mg by mouth 2 (two) times daily.    Historical Provider, MD  metFORMIN (GLUCOPHAGE) 500 MG tablet Take 1,000 mg by mouth 2 (two) times daily with a meal.    Historical Provider, MD  Multiple Vitamin (MULTI-VITAMINS) TABS Take 1 tablet by mouth daily.    Historical Provider, MD  propranolol (INDERAL) 80 MG tablet Take 80 mg by mouth 2 (two) times daily.    Historical Provider, MD  sitaGLIPtin (JANUVIA) 100 MG tablet Take 100 mg by mouth daily.    Historical Provider, MD  SUMAtriptan (IMITREX) 100 MG tablet Take 100 mg by mouth every 2 (two) hours  as needed for migraine.     Historical Provider, MD  tiZANidine (ZANAFLEX) 4 MG tablet Take 2-8 mg by mouth See admin instructions. 2 mg every morning and 8 mg at bedtime    Historical Provider, MD  zolpidem (AMBIEN) 10 MG tablet Take 10 mg by mouth at bedtime as needed for sleep.     Historical Provider, MD    Allergies Patient has no known allergies.  History reviewed. No pertinent family history.  Social History Social History  Substance Use Topics  . Smoking status: Never Smoker  . Smokeless tobacco: Never Used  . Alcohol use No    Review of Systems Constitutional: No fever/chills Eyes: No visual changes. ENT: No sore throat. Cardiovascular: Denies chest pain. Respiratory: Denies shortness of breath. Gastrointestinal: No abdominal pain.  No nausea, no vomiting.  No diarrhea.  No constipation. Genitourinary: Negative for dysuria. Musculoskeletal:Left lateral chest rib pain Skin: Negative for rash. Neurological: Negative for headaches, focal weakness or numbness. 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  ____________________________________________   PHYSICAL EXAM:  VITAL SIGNS: ED Triage Vitals  Enc Vitals Group     BP 04/29/16 1418 (!) 180/90     Pulse Rate 04/29/16 1418 70     Resp 04/29/16 1418 20     Temp 04/29/16 1418 97.5 F (36.4 C)     Temp Source 04/29/16 1418 Oral  SpO2 04/29/16 1418 97 %     Weight 04/29/16 1419 190 lb (86.2 kg)     Height 04/29/16 1419 6\' 1"  (1.854 m)     Head Circumference --      Peak Flow --      Pain Score 04/29/16 1422 6     Pain Loc --      Pain Edu? --      Excl. in GC? --     Constitutional: Alert and oriented. Well appearing and in no acute distress. Eyes: Conjunctivae are normal. PERRL. EOMI. Head: Atraumatic. Nose: No congestion/rhinnorhea. Mouth/Throat: Mucous membranes are moist.  Oropharynx non-erythematous. Neck: No stridor.  No cervical spine tenderness to  palpation. Hematological/Lymphatic/Immunilogical: No cervical lymphadenopathy. Cardiovascular: Normal rate, regular rhythm. Grossly normal heart sounds.  Good peripheral circulation. Respiratory: Normal respiratory effort.  No retractions. Lungs CTAB. Gastrointestinal: Soft and nontender. No distention. No abdominal bruits. No CVA tenderness. Musculoskeletal:No obvious chest wall deformity. Patient equal chest wall expansion. It is noted that the patient has decreased range of motion with the left upper extremity secondary to his TBI. No lower extremity tenderness nor edema.  No joint effusions. Neurologic:  Normal speech and language. No gross focal neurologic deficits are appreciated. No gait instability. Skin:  Skin is warm, dry and intact. No rash noted. No abrasion or ecchymosis. Psychiatric: Mood and affect are normal. Speech and behavior are normal.  ____________________________________________   LABS (all labs ordered are listed, but only abnormal results are displayed)  Labs Reviewed - No data to display ____________________________________________  EKG  EKG read by heart station doctors no acute findings. ____________________________________________  RADIOLOGY  No acute findings on chest x-ray. ____________________________________________   PROCEDURES  Procedure(s) performed: None  Procedures  Critical Care performed: No  ____________________________________________   INITIAL IMPRESSION / ASSESSMENT AND PLAN / ED COURSE  Pertinent labs & imaging results that were available during my care of the patient were reviewed by me and considered in my medical decision making (see chart for details).  Chest wall contusion. Discussed neck x-ray finding with patient. Patient given discharge care instructions. Patient given 3 days a Percocets. His  bloodpressureiselevatedtodaypatientstatetakemedicationsforbloodpressure.AdvisedtodayBPcheckwithfamilydoctor.   ____________________________________________   FINAL CLINICAL IMPRESSION(S) / ED DIAGNOSES  Final diagnoses:  Chest wall pain      NEW MEDICATIONS STARTED DURING THIS VISIT:  New Prescriptions   No medications on file     Note:  This document was prepared using Dragon voice recognition software and may include unintentional dictation errors.    Joni Reining, PA-C 04/29/16 1511    Sharman Cheek, MD 04/29/16 [phone]

## 2016-04-29 NOTE — Discharge Instructions (Signed)
Advise  three-day blood pressure check with PCP secondary to elevated blood pressure 180/90. Take pain medication as needed.

## 2016-04-29 NOTE — ED Triage Notes (Signed)
Pt presents from home via pov with left ribcage pain after falling on that side last night. Pt states he has limited mobility on left side due to a TBI in 1995. Pt states he has had some shortness of breath with the pain. Pt denies loc or hitting his head. Pt alert & oriented with NAD noted.

## 2016-10-16 ENCOUNTER — Emergency Department: Payer: Medicare PPO

## 2016-10-16 ENCOUNTER — Encounter: Payer: Self-pay | Admitting: Emergency Medicine

## 2016-10-16 ENCOUNTER — Emergency Department
Admission: EM | Admit: 2016-10-16 | Discharge: 2016-10-16 | Disposition: A | Discharge status: Home / Self Care | Payer: Medicare PPO | Attending: Student in an Organized Health Care Education/Training Program | Admitting: Student in an Organized Health Care Education/Training Program

## 2016-10-16 DIAGNOSIS — M79602 Pain in left arm: Secondary | ICD-10-CM | POA: Diagnosis not present

## 2016-10-16 DIAGNOSIS — Z7982 Long term (current) use of aspirin: Secondary | ICD-10-CM | POA: Insufficient documentation

## 2016-10-16 DIAGNOSIS — M25511 Pain in right shoulder: Secondary | ICD-10-CM | POA: Insufficient documentation

## 2016-10-16 DIAGNOSIS — Z79899 Other long term (current) drug therapy: Secondary | ICD-10-CM | POA: Diagnosis not present

## 2016-10-16 DIAGNOSIS — E119 Type 2 diabetes mellitus without complications: Secondary | ICD-10-CM | POA: Diagnosis not present

## 2016-10-16 DIAGNOSIS — W01190A Fall on same level from slipping, tripping and stumbling with subsequent striking against furniture, initial encounter: Secondary | ICD-10-CM | POA: Diagnosis not present

## 2016-10-16 DIAGNOSIS — Z7984 Long term (current) use of oral hypoglycemic drugs: Secondary | ICD-10-CM | POA: Diagnosis not present

## 2016-10-16 DIAGNOSIS — W010XXA Fall on same level from slipping, tripping and stumbling without subsequent striking against object, initial encounter: Secondary | ICD-10-CM

## 2016-10-16 DIAGNOSIS — Y92009 Unspecified place in unspecified non-institutional (private) residence as the place of occurrence of the external cause: Secondary | ICD-10-CM

## 2016-10-16 DIAGNOSIS — W19XXXA Unspecified fall, initial encounter: Secondary | ICD-10-CM

## 2016-10-16 MED ORDER — TRAMADOL HCL 50 MG PO TABS
50.0000 mg | ORAL_TABLET | Freq: Once | ORAL | Status: AC
  Administered 2016-10-16: 50 mg via ORAL
  Filled 2016-10-16: qty 1

## 2016-10-16 MED ORDER — TRAMADOL HCL 50 MG PO TABS: 50.0000 mg | ORAL_TABLET | Freq: Two times a day (BID) | ORAL | 0 refills | Status: DC

## 2016-10-16 NOTE — ED Triage Notes (Addendum)
Pt tripped over a fan cord, falling against table with right arm, then landing  onto left upper arm, pt with stagger gait, hx of brain aneurysm.

## 2016-10-16 NOTE — Discharge Instructions (Signed)
Your exam and x-rays are normal after your fall. Take ibuprofen at home and the Ultram as needed for pain relief. Apply ice to any sore muscles. Follow-up your provider for continued symptoms. Return to the ED as needed.

## 2016-10-16 NOTE — ED Notes (Signed)
Patient transported to X-ray 

## 2016-10-16 NOTE — ED Provider Notes (Signed)
[facility] Emergency Department Provider Note ____________________________________________  Time seen: 1624  I have reviewed the triage vital signs and the nursing notes.  HISTORY  Chief Complaint  Fall  HPI Charles Bryan is a 51 y.o. male presents to the EDfor evaluation of injury sustained following a mechanical fall. The patient describes he tripped over a cord to a large fan he was using in the house. He fell hitting his right shoulder on the coffee table, and landing on his abducted left arm. He has some decreased range of motion to the right upper extremity secondary to a brain aneurysm. He denies any head injury, loss of consciousness, or other injury at this time.  Past Medical History:  Diagnosis Date  . Diabetes mellitus without complication (HCC)     There are no active problems to display for this patient.  History reviewed. No pertinent surgical history.  Prior to Admission medications   Medication Sig Start Date End Date Taking? Authorizing Provider  aspirin EC 81 MG tablet Take 81 mg by mouth daily.    [provider]  cephALEXin (KEFLEX) 500 MG capsule Take 1 capsule (500 mg total) by mouth 3 (three) times daily. 10/26/15   Hagler, Jami L, PA-C  gabapentin (NEURONTIN) 800 MG tablet Take 800 mg by mouth 4 (four) times daily.    [provider]  Lacosamide 150 MG TABS Take 150 mg by mouth 2 (two) times daily.    [provider]  levETIRAcetam (KEPPRA XR) 500 MG 24 hr tablet Take 500 mg by mouth 2 (two) times daily.    [provider]  metFORMIN (GLUCOPHAGE) 500 MG tablet Take 1,000 mg by mouth 2 (two) times daily with a meal.    [provider]  Multiple Vitamin (MULTI-VITAMINS) TABS Take 1 tablet by mouth daily.    [provider]  oxyCODONE-acetaminophen (ROXICET) 5-325 MG tablet Take 1 tablet by mouth every 6 (six) hours as needed for moderate pain. 04/29/16   Joni Reining, PA-C   propranolol (INDERAL) 80 MG tablet Take 80 mg by mouth 2 (two) times daily.    [provider]  sitaGLIPtin (JANUVIA) 100 MG tablet Take 100 mg by mouth daily.    [provider]  SUMAtriptan (IMITREX) 100 MG tablet Take 100 mg by mouth every 2 (two) hours as needed for migraine.     [provider]  tiZANidine (ZANAFLEX) 4 MG tablet Take 2-8 mg by mouth See admin instructions. 2 mg every morning and 8 mg at bedtime    [provider]  traMADol (ULTRAM) 50 MG tablet Take 1 tablet (50 mg total) by mouth 2 (two) times daily. 10/16/16   [place] Devine, Charlesetta Ivory, PA-C  zolpidem (AMBIEN) 10 MG tablet Take 10 mg by mouth at bedtime as needed for sleep.     [provider]    Allergies Patient has no known allergies.  No family history on file.  Social History Social History  Substance Use Topics  . Smoking status: Never Smoker  . Smokeless tobacco: Never Used  . Alcohol use No    Review of Systems  Constitutional: Negative for fever. Cardiovascular: Negative for chest pain. Respiratory: Negative for shortness of breath. Musculoskeletal: Negative for back pain. Right shoulder and left arm pain as above. Skin: Negative for rash. Neurological: Negative for headaches, focal weakness or numbness. ____________________________________________  PHYSICAL EXAM:  VITAL SIGNS: ED Triage Vitals  Enc Vitals Group     BP  10/16/16 1605 (!) 167/96     Pulse Rate 10/16/16 1605 71     Resp 10/16/16 1605 18     Temp 10/16/16 1605 98.4 F (36.9 C)     Temp Source 10/16/16 1605 Oral     SpO2 10/16/16 1605 99 %     Weight 10/16/16 1606 205 lb (93 kg)     Height 10/16/16 1606 6\' 1"  (1.854 m)     Head Circumference --      Peak Flow --      Pain Score 10/16/16 1605 7     Pain Loc --      Pain Edu? --      Excl. in GC? --     Constitutional: Alert and oriented. Well appearing and in no distress. Head: Normocephalic and atraumatic. Neck: Supple. No  thyromegaly. Normal range of motion without crepitus. Cardiovascular: Normal rate, regular rhythm. Normal distal pulses. Respiratory: Normal respiratory effort. No wheezes/rales/rhonchi. Gastrointestinal: Soft and nontender. No distention. Musculoskeletal: right shoulder without any obvious deformity, dislocation, or sulcus sign. Patient able to demonstrate normal active range of motion to the right shoulder. Normal composite fist on the right. Nontender with normal range of motion in all other extremities.  Neurologic:  Baseline gait without ataxia. Normal speech and language. No gross focal neurologic deficits are appreciated. Skin:  Skin is warm, dry and intact. No rash noted. ____________________________________________   RADIOLOGY  Left Humerus   IMPRESSION: 1. No acute abnormality. 2. Remote proximal left humerus fracture.  Right Shoulder  IMPRESSION: Negative right shoulder radiographs ____________________________________________  PROCEDURES  Ultram 50 mg PO ____________________________________________  INITIAL IMPRESSION / ASSESSMENT AND PLAN / ED COURSE  Patient with ED evaluation of injuries sustained following a mechanical fall at home. He is reassured by his normal exam and negative x-rays. He is discharged with a prescription for Ultram (#10). He will follow-up with his provider for continued symptoms.  ____________________________________________  FINAL CLINICAL IMPRESSION(S) / ED DIAGNOSES  Final diagnoses:  Fall due to stumbling, initial encounter  Fall in home, initial encounter  Acute pain of right shoulder  Pain of left upper extremity      Tymeka Privette, Charlesetta IvoryJenise V Bacon, PA-C 10/16/16 1920    Willy Eddyobinson, Patrick, MD 10/16/16 1944

## 2017-02-11 ENCOUNTER — Emergency Department: Payer: Medicare PPO

## 2017-02-11 ENCOUNTER — Emergency Department
Admission: EM | Admit: 2017-02-11 | Discharge: 2017-02-11 | Disposition: A | Discharge status: Home / Self Care | Payer: Medicare PPO | Attending: Emergency Medicine | Admitting: Emergency Medicine

## 2017-02-11 ENCOUNTER — Other Ambulatory Visit: Payer: Self-pay

## 2017-02-11 ENCOUNTER — Encounter: Payer: Self-pay | Admitting: Emergency Medicine

## 2017-02-11 DIAGNOSIS — Y999 Unspecified external cause status: Secondary | ICD-10-CM | POA: Insufficient documentation

## 2017-02-11 DIAGNOSIS — Z79899 Other long term (current) drug therapy: Secondary | ICD-10-CM | POA: Diagnosis not present

## 2017-02-11 DIAGNOSIS — S92502A Displaced unspecified fracture of left lesser toe(s), initial encounter for closed fracture: Secondary | ICD-10-CM | POA: Diagnosis not present

## 2017-02-11 DIAGNOSIS — Y9301 Activity, walking, marching and hiking: Secondary | ICD-10-CM | POA: Insufficient documentation

## 2017-02-11 DIAGNOSIS — Y929 Unspecified place or not applicable: Secondary | ICD-10-CM | POA: Diagnosis not present

## 2017-02-11 DIAGNOSIS — E119 Type 2 diabetes mellitus without complications: Secondary | ICD-10-CM | POA: Insufficient documentation

## 2017-02-11 DIAGNOSIS — Z7982 Long term (current) use of aspirin: Secondary | ICD-10-CM | POA: Diagnosis not present

## 2017-02-11 DIAGNOSIS — S99922A Unspecified injury of left foot, initial encounter: Secondary | ICD-10-CM | POA: Diagnosis present

## 2017-02-11 DIAGNOSIS — W228XXA Striking against or struck by other objects, initial encounter: Secondary | ICD-10-CM | POA: Diagnosis not present

## 2017-02-11 DIAGNOSIS — Z7984 Long term (current) use of oral hypoglycemic drugs: Secondary | ICD-10-CM | POA: Insufficient documentation

## 2017-02-11 HISTORY — DX: Migraine, unspecified, not intractable, without status migrainosus: G43.909

## 2017-02-11 HISTORY — DX: Cerebral aneurysm, nonruptured: I67.1

## 2017-02-11 HISTORY — DX: Unspecified convulsions: R56.9

## 2017-02-11 MED ORDER — OXYCODONE-ACETAMINOPHEN 5-325 MG PO TABS: 1.0000 | ORAL_TABLET | Freq: Four times a day (QID) | ORAL | 0 refills | Status: DC | PRN

## 2017-02-11 NOTE — ED Triage Notes (Signed)
Pt reports pain to left second toe since Sunday; pt says he has decreased sensation on his left side from a brain aneurysm years ago and may have kicked something; bruising noted to toe; no swelling present; pt says any pressure on the toe increases the pain

## 2017-02-11 NOTE — Discharge Instructions (Signed)
PLease follow up with orthopedics

## 2017-02-11 NOTE — ED Notes (Signed)
Patient reports he stubbed his 2nd toe, left foot last night. When he awoke this morning it was painful, bruised and swollen.   Patient took aleve at 03:00

## 2017-02-11 NOTE — ED Provider Notes (Signed)
[facility] Emergency Department Provider Note   ____________________________________________   First MD Initiated Contact with Patient 02/11/17 [phone]     (approximate)  I have reviewed the triage vital signs and the nursing notes.   HISTORY  Chief Complaint Toe Injury    HPI Charles Bryan is a 51 y.o. male who comes into the hospital today with her left toe.  He reports that he hit his toe on the corner of a wall yesterday.  He reports that he has been taking Aleve at home and his pain is a 5 out of 10 in intensity.  He reports that he woke up this morning and it was black and blue so he wanted to have it checked out.  He denies any numbness or tingling in his had some mild swelling.  He came in just to evaluate it because of the color change in the swelling.  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  Past Medical History:  Diagnosis Date  . Brain aneurysm 1995  . Diabetes mellitus without complication (HCC)   . Migraines   . Seizures (HCC)     There are no active problems to display for this patient.   Past Surgical History:  Procedure Laterality Date  . BRAIN SURGERY    . CHOLECYSTECTOMY    . PANCREATIC PSEUDOCYST DRAINAGE      Prior to Admission medications   Medication Sig Start Date End Date Taking? Authorizing Provider  aspirin EC 81 MG tablet Take 81 mg by mouth daily.   Yes [provider]  gabapentin (NEURONTIN) 800 MG tablet Take 800 mg by mouth 4 (four) times daily.   Yes [provider]  lacosamide (VIMPAT) 50 MG TABS tablet Take 150 mg by mouth 2 (two) times daily.   Yes [provider]  levETIRAcetam (KEPPRA XR) 500 MG 24 hr tablet Take 500 mg by mouth 2 (two) times daily.   Yes [provider]  metFORMIN (GLUCOPHAGE) 500 MG tablet Take 500 mg by mouth 2 (two) times daily with a meal.    Yes [provider]  Multiple Vitamin (MULTI-VITAMINS) TABS Take 1 tablet by mouth  daily.   Yes [provider]  propranolol (INDERAL) 80 MG tablet Take 80 mg by mouth daily.    Yes [provider]  SUMAtriptan (IMITREX) 100 MG tablet Take 100 mg by mouth every 2 (two) hours as needed for migraine.    Yes [provider]  tiZANidine (ZANAFLEX) 4 MG tablet Take 2-8 mg by mouth See admin instructions. 2 mg every morning and 8 mg at bedtime   Yes [provider]  amitriptyline (ELAVIL) 25 MG tablet Take 1 tablet by mouth daily. 12/17/16   [provider]  glimepiride (AMARYL) 1 MG tablet Take 1 tablet by mouth daily. 11/15/16   [provider]  lisinopril (PRINIVIL,ZESTRIL) 5 MG tablet Take 1 tablet by mouth daily. 11/15/16   [provider]  oxyCODONE-acetaminophen (ROXICET) 5-325 MG tablet Take 1 tablet by mouth every 6 (six) hours as needed. 02/11/17   Rebecka Apley, MD  pioglitazone (ACTOS) 30 MG tablet Take 1 tablet by mouth daily. 11/20/16   [provider]    Allergies Patient has no known allergies.  History reviewed. No pertinent family history.  Social History Social History   Tobacco Use  . Smoking status: Never Smoker  . Smokeless tobacco: Never Used  Substance Use Topics  . Alcohol use: No  .  Drug use: No    Review of Systems  Constitutional: No fever/chills Eyes: No visual changes. ENT: No sore throat. Cardiovascular: Denies chest pain. Respiratory: Denies shortness of breath. Gastrointestinal: No abdominal pain.  No nausea, no vomiting.  No diarrhea.  No constipation. Genitourinary: Negative for dysuria. Musculoskeletal: Tenderness to palpation of left second toe with some bruising noted to the toe.  Color and sensation intact. Skin: Negative for rash. Neurological: Negative for headaches, focal weakness or numbness.   ____________________________________________   PHYSICAL EXAM:  VITAL SIGNS: ED Triage Vitals  Enc Vitals Group     BP 02/11/17 0438 (!) 161/89      Pulse Rate 02/11/17 0438 63     Resp 02/11/17 0438 18     Temp 02/11/17 0438 (!) 97.5 F (36.4 C)     Temp Source 02/11/17 0438 Oral     SpO2 02/11/17 0438 99 %     Weight 02/11/17 0438 190 lb (86.2 kg)     Height 02/11/17 0438 6\' 1"  (1.854 m)     Head Circumference --      Peak Flow --      Pain Score 02/11/17 0437 5     Pain Loc --      Pain Edu? --      Excl. in GC? --     Constitutional: Alert and oriented. Well appearing and in mild distress. Eyes: Conjunctivae are normal. PERRL. EOMI. Head: Atraumatic. Nose: No congestion/rhinnorhea. Mouth/Throat: Mucous membranes are moist.  Oropharynx non-erythematous. Cardiovascular: Normal rate, regular rhythm. Grossly normal heart sounds.  Good peripheral circulation. Respiratory: Normal respiratory effort.  No retractions. Lungs CTAB. Gastrointestinal: Soft and nontender. No distention.  Musculoskeletal: Tenderness to palpation of second left toe Neurologic:  Normal speech and language.  Skin:  Skin is warm, dry and intact.  Purple black and blue discoloration to second left toe Psychiatric: Mood and affect are normal.   ____________________________________________   LABS (all labs ordered are listed, but only abnormal results are displayed)  Labs Reviewed - No data to display ____________________________________________  EKG  none ____________________________________________  RADIOLOGY  Dg Toe 2nd Left  Result Date: 02/11/2017 CLINICAL DATA:  52 y/o  M; left second toe pain.  Possible injury. EXAM: LEFT SECOND TOE COMPARISON:  None. FINDINGS: Mildly displaced acute fracture of the dorsal base of second middle phalanx. No joint dislocation. IMPRESSION: Mildly displaced acute fracture of dorsal base of second middle phalanx. No joint dislocation. Electronically Signed   By: Mitzi Hansen M.D.   On: 02/11/2017 05:18    ____________________________________________   PROCEDURES  Procedure(s) performed:  None  Procedures  Critical Care performed: No  ____________________________________________   INITIAL IMPRESSION / ASSESSMENT AND PLAN / ED COURSE  As part of my medical decision making, I reviewed the following data within the electronic MEDICAL RECORD NUMBER Notes from prior ED visits and [place] Controlled Substance Database   This is a 52 year old male who comes into the hospital today after hitting his foot in the corner of a wall.  The patient had some discoloration of his second left toe so he came in for evaluation.    My differential diagnosis includes musculoskeletal pain versus fracture.  The patient's x-ray shows a mildly displaced acute fracture of the dorsal base of the second middle phalanx, no joint dislocation.  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  He will be discharged to follow-up  with orthopedics.       ____________________________________________   FINAL CLINICAL IMPRESSION(S) / ED DIAGNOSES  Final diagnoses:  Closed fracture of phalanx of left second toe, initial encounter     ED Discharge Orders        Ordered    oxyCODONE-acetaminophen (ROXICET) 5-325 MG tablet  Every 6 hours PRN     02/11/17 0859       Note:  This document was prepared using Dragon voice recognition software and may include unintentional dictation errors.    Rebecka ApleyWebster, Allison P, MD 02/11/17 [id number]

## 2017-04-25 ENCOUNTER — Emergency Department
Admission: EM | Admit: 2017-04-25 | Discharge: 2017-04-25 | Disposition: A | Discharge status: Home / Self Care | Payer: Medicare PPO | Attending: Emergency Medicine | Admitting: Emergency Medicine

## 2017-04-25 ENCOUNTER — Emergency Department: Payer: Medicare PPO

## 2017-04-25 ENCOUNTER — Encounter: Payer: Self-pay | Admitting: Emergency Medicine

## 2017-04-25 DIAGNOSIS — S2242XA Multiple fractures of ribs, left side, initial encounter for closed fracture: Secondary | ICD-10-CM | POA: Diagnosis not present

## 2017-04-25 DIAGNOSIS — Y92009 Unspecified place in unspecified non-institutional (private) residence as the place of occurrence of the external cause: Secondary | ICD-10-CM | POA: Diagnosis not present

## 2017-04-25 DIAGNOSIS — W01190A Fall on same level from slipping, tripping and stumbling with subsequent striking against furniture, initial encounter: Secondary | ICD-10-CM | POA: Insufficient documentation

## 2017-04-25 DIAGNOSIS — Y999 Unspecified external cause status: Secondary | ICD-10-CM | POA: Diagnosis not present

## 2017-04-25 DIAGNOSIS — Z7984 Long term (current) use of oral hypoglycemic drugs: Secondary | ICD-10-CM | POA: Insufficient documentation

## 2017-04-25 DIAGNOSIS — S299XXA Unspecified injury of thorax, initial encounter: Secondary | ICD-10-CM | POA: Diagnosis present

## 2017-04-25 DIAGNOSIS — Z79899 Other long term (current) drug therapy: Secondary | ICD-10-CM | POA: Diagnosis not present

## 2017-04-25 DIAGNOSIS — Y939 Activity, unspecified: Secondary | ICD-10-CM | POA: Insufficient documentation

## 2017-04-25 DIAGNOSIS — E119 Type 2 diabetes mellitus without complications: Secondary | ICD-10-CM | POA: Diagnosis not present

## 2017-04-25 DIAGNOSIS — Z7982 Long term (current) use of aspirin: Secondary | ICD-10-CM | POA: Insufficient documentation

## 2017-04-25 MED ORDER — IBUPROFEN 600 MG PO TABS: 600.0000 mg | ORAL_TABLET | Freq: Four times a day (QID) | ORAL | 0 refills | Status: DC | PRN

## 2017-04-25 NOTE — ED Provider Notes (Signed)
[facility] Emergency Department Provider Note  ____________________________________________  Time seen: Approximately 2:30 PM  I have reviewed the triage vital signs and the nursing notes.   HISTORY  Chief Complaint Fall    HPI Charles Bryan is a 52 y.o. male that presents to the emergency department for evaluation of left rib pain after falling this morning.  Patient has laminate floor and slipped on the floor.  He is left side landed on a coffee table.  He did not hit his head or lose consciousness. Tetanus shot was within 5 years. He denies headache, visual changes, dizziness, confusion, neck pain, shortness of breath, chest pain.   Past Medical History:  Diagnosis Date  . Brain aneurysm 1995  . Diabetes mellitus without complication (HCC)   . Migraines   . Seizures (HCC)     There are no active problems to display for this patient.   Past Surgical History:  Procedure Laterality Date  . BRAIN SURGERY    . CHOLECYSTECTOMY    . PANCREATIC PSEUDOCYST DRAINAGE      Prior to Admission medications   Medication Sig Start Date End Date Taking? Authorizing Provider  amitriptyline (ELAVIL) 25 MG tablet Take 1 tablet by mouth daily. 12/17/16   [provider]  aspirin EC 81 MG tablet Take 81 mg by mouth daily.    [provider]  gabapentin (NEURONTIN) 800 MG tablet Take 800 mg by mouth 4 (four) times daily.    [provider]  glimepiride (AMARYL) 1 MG tablet Take 1 tablet by mouth daily. 11/15/16   [provider]  ibuprofen (ADVIL,MOTRIN) 600 MG tablet Take 1 tablet (600 mg total) by mouth every 6 (six) hours as needed. 04/25/17   Enid DerryWagner, Temperance Kelemen, PA-C  lacosamide (VIMPAT) 50 MG TABS tablet Take 150 mg by mouth 2 (two) times daily.    [provider]  levETIRAcetam (KEPPRA XR) 500 MG 24 hr tablet Take 500 mg by mouth 2 (two) times daily.    [provider]  lisinopril (PRINIVIL,ZESTRIL) 5 MG  tablet Take 1 tablet by mouth daily. 11/15/16   [provider]  metFORMIN (GLUCOPHAGE) 500 MG tablet Take 500 mg by mouth 2 (two) times daily with a meal.     [provider]  Multiple Vitamin (MULTI-VITAMINS) TABS Take 1 tablet by mouth daily.    [provider]  oxyCODONE-acetaminophen (ROXICET) 5-325 MG tablet Take 1 tablet by mouth every 6 (six) hours as needed. 02/11/17   Rebecka ApleyWebster, Allison P, MD  pioglitazone (ACTOS) 30 MG tablet Take 1 tablet by mouth daily. 11/20/16   [provider]  propranolol (INDERAL) 80 MG tablet Take 80 mg by mouth daily.     [provider]  SUMAtriptan (IMITREX) 100 MG tablet Take 100 mg by mouth every 2 (two) hours as needed for migraine.     [provider]  tiZANidine (ZANAFLEX) 4 MG tablet Take 2-8 mg by mouth See admin instructions. 2 mg every morning and 8 mg at bedtime    [provider]    Allergies Patient has no known allergies.  No family history on file.  Social History Social History   Tobacco Use  . Smoking status: Never Smoker  . Smokeless tobacco: Never Used  Substance Use Topics  . Alcohol use: No  . Drug use: No     Review of Systems  Cardiovascular: No chest pain. Respiratory: No SOB. Gastrointestinal: No abdominal pain.  No nausea, no vomiting.  Musculoskeletal: Positive for rib pain. Skin: Negative for rash, lacerations, ecchymosis.  Positive for abrasion. Neurological: Negative for headaches   ____________________________________________   PHYSICAL EXAM:  VITAL SIGNS: ED Triage Vitals [04/25/17 1336]  Enc Vitals Group     BP (!) 160/92     Pulse Rate 68     Resp 16     Temp 97.7 F (36.5 C)     Temp Source Oral     SpO2 98 %     Weight 190 lb (86.2 kg)     Height 6\' 1"  (1.854 m)     Head Circumference      Peak Flow      Pain Score 8     Pain Loc      Pain Edu?      Excl. in GC?      Constitutional: Alert and oriented. Well appearing and in  no acute distress. Eyes: Conjunctivae are normal. PERRL. EOMI. Head: Atraumatic. ENT:      Ears:      Nose: No congestion/rhinnorhea.      Mouth/Throat: Mucous membranes are moist.  Neck: No stridor.  No cervical spine tenderness to palpation. Cardiovascular: Normal rate, regular rhythm.  Good peripheral circulation. Respiratory: Normal respiratory effort without tachypnea or retractions. Lungs CTAB. Good air entry to the bases with no decreased or absent breath sounds. Gastrointestinal: Bowel sounds 4 quadrants. Soft and nontender to palpation. No guarding or rigidity. No palpable masses. No distention.  Musculoskeletal: Full range of motion to all extremities. No gross deformities appreciated.   Neurologic:  Normal speech and language. No gross focal neurologic deficits are appreciated.  Skin:  Skin is warm, dry and intact. Abrasion to left inferior, posterior rib cage.   ____________________________________________   LABS (all labs ordered are listed, but only abnormal results are displayed)  Labs Reviewed - No data to display ____________________________________________  EKG   ____________________________________________  RADIOLOGY Lexine Baton, personally viewed and evaluated these images (plain radiographs) as part of my medical decision making, as well as reviewing the written report by the radiologist.  Dg Ribs Unilateral W/chest Left  Result Date: 04/25/2017 CLINICAL DATA:  Fall.  Left-sided rib pain. EXAM: LEFT RIBS AND CHEST - 3+ VIEW COMPARISON:  04/29/2016. FINDINGS: Mediastinum and hilar structures normal. Cardiomegaly with normal pulmonary vascularity. Low lung volumes with mild basilar atelectasis. Ribs are numbered with the lowest ribbed identified as T12. Subtle nondisplaced ninth, tenth, and eleventh rib fractures may be present. No pneumothorax noted. No pleural effusion. Surgical clips and sutures are noted over the abdomen. Dilated loops of bowel,  including possibly small bowel noted. Abdominal series can be obtained to further evaluate IMPRESSION: 1. Subtle nondisplaced ninth, tenth, eleventh rib fractures may be present. No pneumothorax noted. 2. Surgical clips and sutures noted the upper abdomen. Dilated loops of bowel are noted. Abdominal series can be obtained to further evaluate as needed. Electronically Signed   By: Maisie Fus  Register   On: 04/25/2017 16:00    ____________________________________________    PROCEDURES  Procedure(s) performed:    Procedures    Medications - No data to display   ____________________________________________   INITIAL IMPRESSION / ASSESSMENT AND PLAN / ED COURSE  Pertinent labs & imaging results that were available during my care of the patient were reviewed by me and considered in my medical decision making (see chart for details).  Review of the [place] CSRS was performed in accordance of the [facility] prior to dispensing any controlled drugs.  Patient presents emergency department for evaluation after mechanical fall.  Vital signs and exam are reassuring.  Left rib with chest x-ray negative indicates possible 9th, 10th, 11th non displaced rib fracures.  X-ray also shows dilated loops of bowel.  Patient denies any nausea, vomiting, abdominal pain.  Last bowel movement was yesterday and was normal.  He would like to take ibuprofen and Tylenol for pain.  Patient will be discharged home with prescriptions for ibuprofen. Patient is to follow up with PCP as directed. Patient is given ED precautions to return to the ED for any worsening or new symptoms.     ____________________________________________  FINAL CLINICAL IMPRESSION(S) / ED DIAGNOSES  Final diagnoses:  Closed fracture of multiple ribs of left side, initial encounter      NEW MEDICATIONS STARTED DURING THIS VISIT:  ED Discharge Orders        Ordered    ibuprofen (ADVIL,MOTRIN) 600 MG tablet  Every 6 hours PRN     04/25/17  1608          This chart was dictated using voice recognition software/Dragon. Despite best efforts to proofread, errors can occur which can change the meaning. Any change was purely unintentional.    Enid Derry, PA-C 04/25/17 1611    Governor Rooks, MD 04/28/17 1020

## 2017-04-25 NOTE — ED Triage Notes (Signed)
Pt comes into the ED via POV c/o a mechanical fall this morning where he landed on the right side.  Patient is c/o upper back pain on the side he landed on.  Patient in NAD at this time and is ambulatory to triage.  Patient has even and unlabored respirations.

## 2017-05-22 ENCOUNTER — Other Ambulatory Visit: Payer: Self-pay

## 2017-05-22 ENCOUNTER — Emergency Department: Payer: Medicare PPO

## 2017-05-22 ENCOUNTER — Encounter: Payer: Self-pay | Admitting: Emergency Medicine

## 2017-05-22 ENCOUNTER — Emergency Department
Admission: EM | Admit: 2017-05-22 | Discharge: 2017-05-22 | Disposition: A | Discharge status: Home / Self Care | Payer: Medicare PPO | Attending: Student in an Organized Health Care Education/Training Program | Admitting: Student in an Organized Health Care Education/Training Program

## 2017-05-22 DIAGNOSIS — Y999 Unspecified external cause status: Secondary | ICD-10-CM | POA: Diagnosis not present

## 2017-05-22 DIAGNOSIS — Z7982 Long term (current) use of aspirin: Secondary | ICD-10-CM | POA: Insufficient documentation

## 2017-05-22 DIAGNOSIS — M25512 Pain in left shoulder: Secondary | ICD-10-CM | POA: Insufficient documentation

## 2017-05-22 DIAGNOSIS — R52 Pain, unspecified: Secondary | ICD-10-CM

## 2017-05-22 DIAGNOSIS — Y939 Activity, unspecified: Secondary | ICD-10-CM | POA: Insufficient documentation

## 2017-05-22 DIAGNOSIS — Z79899 Other long term (current) drug therapy: Secondary | ICD-10-CM | POA: Diagnosis not present

## 2017-05-22 DIAGNOSIS — M25511 Pain in right shoulder: Secondary | ICD-10-CM | POA: Diagnosis present

## 2017-05-22 DIAGNOSIS — Z7984 Long term (current) use of oral hypoglycemic drugs: Secondary | ICD-10-CM | POA: Insufficient documentation

## 2017-05-22 DIAGNOSIS — E119 Type 2 diabetes mellitus without complications: Secondary | ICD-10-CM | POA: Insufficient documentation

## 2017-05-22 DIAGNOSIS — Y929 Unspecified place or not applicable: Secondary | ICD-10-CM | POA: Insufficient documentation

## 2017-05-22 MED ORDER — OXYCODONE-ACETAMINOPHEN 5-325 MG PO TABS: 1.0000 | ORAL_TABLET | Freq: Four times a day (QID) | ORAL | 0 refills | Status: DC | PRN

## 2017-05-22 MED ORDER — IBUPROFEN 600 MG PO TABS
600.0000 mg | ORAL_TABLET | Freq: Once | ORAL | Status: AC
  Administered 2017-05-22: 600 mg via ORAL
  Filled 2017-05-22: qty 1

## 2017-05-22 MED ORDER — IBUPROFEN 600 MG PO TABS: 600.0000 mg | ORAL_TABLET | Freq: Four times a day (QID) | ORAL | 0 refills | Status: DC | PRN

## 2017-05-22 MED ORDER — OXYCODONE-ACETAMINOPHEN 5-325 MG PO TABS
1.0000 | ORAL_TABLET | Freq: Once | ORAL | Status: AC
  Administered 2017-05-22: 1 via ORAL
  Filled 2017-05-22: qty 1
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
## 2017-05-22 NOTE — Discharge Instructions (Signed)
Follow discharge care instruction take medication as directed. °
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
## 2017-05-24 ENCOUNTER — Other Ambulatory Visit: Payer: Self-pay

## 2017-05-24 ENCOUNTER — Emergency Department
Admission: EM | Admit: 2017-05-24 | Discharge: 2017-05-24 | Disposition: A | Discharge status: Home / Self Care | Payer: Medicare PPO | Attending: Emergency Medicine | Admitting: Emergency Medicine

## 2017-05-24 ENCOUNTER — Emergency Department: Payer: Medicare PPO

## 2017-05-24 DIAGNOSIS — Z7982 Long term (current) use of aspirin: Secondary | ICD-10-CM | POA: Insufficient documentation

## 2017-05-24 DIAGNOSIS — E119 Type 2 diabetes mellitus without complications: Secondary | ICD-10-CM | POA: Diagnosis not present

## 2017-05-24 DIAGNOSIS — Z7984 Long term (current) use of oral hypoglycemic drugs: Secondary | ICD-10-CM | POA: Insufficient documentation

## 2017-05-24 DIAGNOSIS — Z79899 Other long term (current) drug therapy: Secondary | ICD-10-CM | POA: Insufficient documentation

## 2017-05-24 DIAGNOSIS — Z9049 Acquired absence of other specified parts of digestive tract: Secondary | ICD-10-CM | POA: Diagnosis not present

## 2017-05-24 DIAGNOSIS — S299XXD Unspecified injury of thorax, subsequent encounter: Secondary | ICD-10-CM | POA: Diagnosis present

## 2017-05-24 DIAGNOSIS — M25522 Pain in left elbow: Secondary | ICD-10-CM | POA: Insufficient documentation

## 2017-05-24 DIAGNOSIS — S2232XG Fracture of one rib, left side, subsequent encounter for fracture with delayed healing: Secondary | ICD-10-CM | POA: Diagnosis not present

## 2017-05-24 NOTE — ED Notes (Signed)
Portable XR at bedside

## 2017-05-24 NOTE — Discharge Instructions (Addendum)
Discussed x-ray findings with patient.  No fracture or deformity to the left arm and elbow.  No change in nondisplaced rib fracture from previous exam.  Continue previous medications.
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
## 2017-05-24 NOTE — ED Notes (Signed)
ED Provider at bedside. 
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
## 2017-06-19 ENCOUNTER — Ambulatory Visit: Payer: Self-pay | Admitting: Unknown Physician Specialty

## 2017-06-26 ENCOUNTER — Other Ambulatory Visit (HOSPITAL_COMMUNITY): Payer: Self-pay | Admitting: Unknown Physician Specialty

## 2017-06-26 DIAGNOSIS — S40011D Contusion of right shoulder, subsequent encounter: Secondary | ICD-10-CM

## 2017-07-16 ENCOUNTER — Encounter (INDEPENDENT_AMBULATORY_CARE_PROVIDER_SITE_OTHER): Payer: Self-pay

## 2017-07-16 ENCOUNTER — Ambulatory Visit
Admission: RE | Admit: 2017-07-16 | Discharge: 2017-07-16 | Disposition: A | Discharge status: Home / Self Care | Payer: Medicare PPO | Source: Ambulatory Visit | Attending: Unknown Physician Specialty | Admitting: Unknown Physician Specialty

## 2017-07-16 DIAGNOSIS — S40011D Contusion of right shoulder, subsequent encounter: Secondary | ICD-10-CM | POA: Diagnosis present

## 2017-07-16 DIAGNOSIS — M7521 Bicipital tendinitis, right shoulder: Secondary | ICD-10-CM | POA: Insufficient documentation

## 2017-07-16 DIAGNOSIS — X58XXXD Exposure to other specified factors, subsequent encounter: Secondary | ICD-10-CM | POA: Diagnosis not present

## 2017-07-16 DIAGNOSIS — M19011 Primary osteoarthritis, right shoulder: Secondary | ICD-10-CM | POA: Insufficient documentation

## 2017-09-13 ENCOUNTER — Other Ambulatory Visit: Payer: Self-pay | Admitting: Unknown Physician Specialty

## 2017-09-13 DIAGNOSIS — M5412 Radiculopathy, cervical region: Secondary | ICD-10-CM

## 2017-09-22 ENCOUNTER — Ambulatory Visit: Payer: Medicare PPO

## 2017-09-27 ENCOUNTER — Ambulatory Visit
Admission: RE | Admit: 2017-09-27 | Discharge: 2017-09-27 | Disposition: A | Discharge status: Home / Self Care | Payer: No Typology Code available for payment source | Source: Ambulatory Visit | Attending: Unknown Physician Specialty | Admitting: Unknown Physician Specialty

## 2017-09-27 DIAGNOSIS — M4802 Spinal stenosis, cervical region: Secondary | ICD-10-CM | POA: Diagnosis not present

## 2017-09-27 DIAGNOSIS — M5412 Radiculopathy, cervical region: Secondary | ICD-10-CM

## 2017-09-27 DIAGNOSIS — M50122 Cervical disc disorder at C5-C6 level with radiculopathy: Secondary | ICD-10-CM | POA: Insufficient documentation

## 2017-10-14 ENCOUNTER — Other Ambulatory Visit: Payer: Self-pay

## 2017-10-14 ENCOUNTER — Emergency Department
Admission: EM | Admit: 2017-10-14 | Discharge: 2017-10-14 | Disposition: A | Discharge status: Home / Self Care | Payer: Medicare PPO | Attending: Emergency Medicine | Admitting: Emergency Medicine

## 2017-10-14 ENCOUNTER — Emergency Department: Payer: Medicare PPO

## 2017-10-14 ENCOUNTER — Encounter: Payer: Self-pay | Admitting: Emergency Medicine

## 2017-10-14 DIAGNOSIS — Z79899 Other long term (current) drug therapy: Secondary | ICD-10-CM | POA: Diagnosis not present

## 2017-10-14 DIAGNOSIS — Y92003 Bedroom of unspecified non-institutional (private) residence as the place of occurrence of the external cause: Secondary | ICD-10-CM | POA: Insufficient documentation

## 2017-10-14 DIAGNOSIS — W06XXXA Fall from bed, initial encounter: Secondary | ICD-10-CM | POA: Insufficient documentation

## 2017-10-14 DIAGNOSIS — Z7984 Long term (current) use of oral hypoglycemic drugs: Secondary | ICD-10-CM | POA: Diagnosis not present

## 2017-10-14 DIAGNOSIS — Y999 Unspecified external cause status: Secondary | ICD-10-CM | POA: Diagnosis not present

## 2017-10-14 DIAGNOSIS — S0992XA Unspecified injury of nose, initial encounter: Secondary | ICD-10-CM | POA: Diagnosis present

## 2017-10-14 DIAGNOSIS — Y939 Activity, unspecified: Secondary | ICD-10-CM | POA: Insufficient documentation

## 2017-10-14 DIAGNOSIS — Z7982 Long term (current) use of aspirin: Secondary | ICD-10-CM | POA: Insufficient documentation

## 2017-10-14 DIAGNOSIS — S0033XA Contusion of nose, initial encounter: Secondary | ICD-10-CM | POA: Diagnosis not present

## 2017-10-14 DIAGNOSIS — W2209XA Striking against other stationary object, initial encounter: Secondary | ICD-10-CM | POA: Insufficient documentation

## 2017-10-14 DIAGNOSIS — E119 Type 2 diabetes mellitus without complications: Secondary | ICD-10-CM | POA: Diagnosis not present

## 2017-10-14 MED ORDER — NAPROXEN 500 MG PO TABS: 500.0000 mg | ORAL_TABLET | Freq: Two times a day (BID) | ORAL | Status: DC

## 2017-10-14 NOTE — ED Triage Notes (Signed)
Pt was sitting on side of bed and slipped hitting nose on nightstand.  Pain to nose. No bleeding.

## 2017-10-14 NOTE — ED Provider Notes (Signed)
[facility] Emergency Department Provider Note   ____________________________________________   First MD Initiated Contact with Patient 10/14/17 [phone]     (approximate)  I have reviewed the triage vital signs and the nursing notes.   HISTORY  Chief Complaint Facial Injury    HPI Charles Bryan is a 52 y.o. male patient complain of nasal pain secondary to his nose on the nightstand.  Patient stated nosebleed lasting approximately 2 to 3 minutes.  Rates his pain as 8/10.  Described pain is "ache".  No palliative measure for complaint.  Past Medical History:  Diagnosis Date  . Brain aneurysm 1995  . Diabetes mellitus without complication (HCC)   . Migraines   . Seizures (HCC)     There are no active problems to display for this patient.   Past Surgical History:  Procedure Laterality Date  . BRAIN SURGERY    . CHOLECYSTECTOMY    . PANCREATIC PSEUDOCYST DRAINAGE      Prior to Admission medications   Medication Sig Start Date End Date Taking? Authorizing Provider  amitriptyline (ELAVIL) 25 MG tablet Take 1 tablet by mouth daily. 12/17/16   [provider]  aspirin EC 81 MG tablet Take 81 mg by mouth daily.    [provider]  gabapentin (NEURONTIN) 800 MG tablet Take 800 mg by mouth 4 (four) times daily.    [provider]  glimepiride (AMARYL) 1 MG tablet Take 1 tablet by mouth daily. 11/15/16   [provider]  ibuprofen (ADVIL,MOTRIN) 600 MG tablet Take 1 tablet (600 mg total) by mouth every 6 (six) hours as needed. 05/22/17   Joni Reining, PA-C  lacosamide (VIMPAT) 50 MG TABS tablet Take 150 mg by mouth 2 (two) times daily.    [provider]  levETIRAcetam (KEPPRA XR) 500 MG 24 hr tablet Take 500 mg by mouth 2 (two) times daily.    [provider]  lisinopril (PRINIVIL,ZESTRIL) 5 MG tablet Take 1 tablet by mouth daily. 11/15/16   [provider]  metFORMIN (GLUCOPHAGE) 500 MG  tablet Take 500 mg by mouth 2 (two) times daily with a meal.     [provider]  Multiple Vitamin (MULTI-VITAMINS) TABS Take 1 tablet by mouth daily.    [provider]  naproxen (NAPROSYN) 500 MG tablet Take 1 tablet (500 mg total) by mouth 2 (two) times daily with a meal. 10/14/17   Joni Reining, PA-C  oxyCODONE-acetaminophen (ROXICET) 5-325 MG tablet Take 1 tablet by mouth every 6 (six) hours as needed. 05/22/17   Joni Reining, PA-C  pioglitazone (ACTOS) 30 MG tablet Take 1 tablet by mouth daily. 11/20/16   [provider]  propranolol (INDERAL) 80 MG tablet Take 80 mg by mouth daily.     [provider]  SUMAtriptan (IMITREX) 100 MG tablet Take 100 mg by mouth every 2 (two) hours as needed for migraine.     [provider]  tiZANidine (ZANAFLEX) 4 MG tablet Take 2-8 mg by mouth See admin instructions. 2 mg every morning and 8 mg at bedtime    [provider]    Allergies Patient has no known allergies.  History reviewed. No pertinent family history.  Social History Social History   Tobacco Use  . Smoking status: Never Smoker  . Smokeless tobacco: Never Used  Substance Use Topics  . Alcohol use: No  . Drug use: No    Review of Systems  Constitutional: No fever/chills  Eyes: No visual changes. ENT: No sore throat. Cardiovascular: Denies chest pain. Respiratory: Denies shortness of breath. Gastrointestinal: No abdominal pain.  No nausea, no vomiting.  No diarrhea.  No constipation. Genitourinary: Negative for dysuria. Musculoskeletal: Negative for back pain. Skin: Negative for rash. Neurological: Negative for headaches, focal weakness or numbness.  ____________________________________________   PHYSICAL EXAM:  VITAL SIGNS: ED Triage Vitals  Enc Vitals Group     BP 10/14/17 0904 (!) 148/82     Pulse Rate 10/14/17 0904 67     Resp 10/14/17 0904 16     Temp 10/14/17 0904 97.8 F (36.6 C)     Temp Source  10/14/17 0904 Oral     SpO2 10/14/17 0904 98 %     Weight 10/14/17 0902 195 lb (88.5 kg)     Height 10/14/17 0902 6\' 1"  (1.854 m)     Head Circumference --      Peak Flow --      Pain Score 10/14/17 0902 8     Pain Loc --      Pain Edu? --      Excl. in GC? --    Constitutional: Alert and oriented. Well appearing and in no acute distress. Nose: Nasal edema but no deformity. Cardiovascular: Normal rate, regular rhythm. Grossly normal heart sounds.  Good peripheral circulation. Respiratory: Normal respiratory effort.  No retractions. Lungs CTAB. Skin:  Skin is warm, dry and intact. No rash noted. Psychiatric: Mood and affect are normal. Speech and behavior are normal.  ____________________________________________   LABS (all labs ordered are listed, but only abnormal results are displayed)  Labs Reviewed - No data to display ____________________________________________  EKG   ____________________________________________  RADIOLOGY  ED MD interpretation:    Official radiology report(s): Dg Nasal Bones  Result Date: 10/14/2017 CLINICAL DATA:  Pain post trauma. EXAM: NASAL BONES - 3+ VIEW COMPARISON:  None. FINDINGS: There is no evidence of fracture or other bone abnormality. IMPRESSION: Negative. Electronically Signed   By: Ted Mcalpine M.D.   On: 10/14/2017 09:41    ____________________________________________   PROCEDURES  Procedure(s) performed: None  Procedures  Critical Care performed: No  ____________________________________________   INITIAL IMPRESSION / ASSESSMENT AND PLAN / ED COURSE  As part of my medical decision making, I reviewed the following data within the electronic MEDICAL RECORD NUMBER    Nasal pain secondary to contusion.  Discussed negative x-ray findings with patient.  Patient given discharge care instruction.  Patient advised follow-up PCP for continued care.      ____________________________________________   FINAL CLINICAL  IMPRESSION(S) / ED DIAGNOSES  Final diagnoses:  Contusion of nose, initial encounter     ED Discharge Orders         Ordered    naproxen (NAPROSYN) 500 MG tablet  2 times daily with meals     10/14/17 0946           Note:  This document was prepared using Dragon voice recognition software and may include unintentional dictation errors.    Joni Reining, PA-C 10/14/17 1610    Sharman Cheek, MD 10/15/17 [phone]

## 2017-10-14 NOTE — ED Notes (Signed)
See triage note  States he slipped from bed  Hit nightstand  Having pain to nasal area  No active bleeding noted

## 2017-12-11 ENCOUNTER — Emergency Department: Payer: Medicare PPO

## 2017-12-11 ENCOUNTER — Other Ambulatory Visit: Payer: Self-pay

## 2017-12-11 ENCOUNTER — Encounter: Payer: Self-pay | Admitting: Emergency Medicine

## 2017-12-11 ENCOUNTER — Emergency Department
Admission: EM | Admit: 2017-12-11 | Discharge: 2017-12-11 | Disposition: A | Discharge status: Home / Self Care | Payer: Medicare PPO | Attending: Emergency Medicine | Admitting: Emergency Medicine

## 2017-12-11 DIAGNOSIS — S2232XA Fracture of one rib, left side, initial encounter for closed fracture: Secondary | ICD-10-CM | POA: Insufficient documentation

## 2017-12-11 DIAGNOSIS — Z79899 Other long term (current) drug therapy: Secondary | ICD-10-CM | POA: Diagnosis not present

## 2017-12-11 DIAGNOSIS — Y92480 Sidewalk as the place of occurrence of the external cause: Secondary | ICD-10-CM | POA: Insufficient documentation

## 2017-12-11 DIAGNOSIS — Y999 Unspecified external cause status: Secondary | ICD-10-CM | POA: Insufficient documentation

## 2017-12-11 DIAGNOSIS — W010XXA Fall on same level from slipping, tripping and stumbling without subsequent striking against object, initial encounter: Secondary | ICD-10-CM | POA: Insufficient documentation

## 2017-12-11 DIAGNOSIS — S299XXA Unspecified injury of thorax, initial encounter: Secondary | ICD-10-CM | POA: Diagnosis present

## 2017-12-11 DIAGNOSIS — M25522 Pain in left elbow: Secondary | ICD-10-CM | POA: Insufficient documentation

## 2017-12-11 DIAGNOSIS — W19XXXA Unspecified fall, initial encounter: Secondary | ICD-10-CM

## 2017-12-11 DIAGNOSIS — S0990XA Unspecified injury of head, initial encounter: Secondary | ICD-10-CM | POA: Insufficient documentation

## 2017-12-11 DIAGNOSIS — Z7984 Long term (current) use of oral hypoglycemic drugs: Secondary | ICD-10-CM | POA: Diagnosis not present

## 2017-12-11 DIAGNOSIS — E119 Type 2 diabetes mellitus without complications: Secondary | ICD-10-CM | POA: Insufficient documentation

## 2017-12-11 DIAGNOSIS — Y9301 Activity, walking, marching and hiking: Secondary | ICD-10-CM | POA: Insufficient documentation

## 2017-12-11 DIAGNOSIS — Z7982 Long term (current) use of aspirin: Secondary | ICD-10-CM | POA: Diagnosis not present

## 2017-12-11 MED ORDER — OXYCODONE-ACETAMINOPHEN 5-325 MG PO TABS: 1.0000 | ORAL_TABLET | Freq: Three times a day (TID) | ORAL | 0 refills | Status: AC | PRN

## 2017-12-11 MED ORDER — OXYCODONE-ACETAMINOPHEN 5-325 MG PO TABS
1.0000 | ORAL_TABLET | Freq: Once | ORAL | Status: AC
  Administered 2017-12-11: 1 via ORAL
  Filled 2017-12-11: qty 1

## 2017-12-11 NOTE — ED Triage Notes (Signed)
Pt in via POV with complaints of mechanical fall today, landing on sidewalk on left arm.  Pt with complaints of left arm and left rib pain.  Pt denies hitting head, denies LOC, denies blood thinners.  Pt ambulatory to triage, vitals WDL, NAD noted at this time.

## 2017-12-11 NOTE — ED Provider Notes (Signed)
[facility] Emergency Department Provider Note  ____________________________________________  Time seen: Approximately 3:46 PM  I have reviewed the triage vital signs and the nursing notes.   HISTORY  Chief Complaint Fall    HPI Charles Bryan is a 52 y.o. male history of brain aneurysm, presents to the emergency department after a mechanical, non-syncopal fall.  Patient reports that he was ambulating tripped with his left foot, falling to the sidewalk.  Patient reports that he fell on his left arm and left ribs.  He is primarily complaining of pain in the distribution of the left elbow and left upper arm.  Patient reports that he has deficits in range of motion of the left upper extremity following brain aneurysm in 1995.  Patient reports that when he fell, he had the "wind knocked out of him".  He felt momentarily dizzy but was able to stand and walk.  Dizziness has since subsided.  No changes in blurry vision, nausea or emesis.  Patient denies neck pain or numbness or tingling in the upper or lower extremities.  Patient sustained abrasions to the dorsal aspect of the left hand.  He reports that his tetanus status is up-to-date.  Patient denies chest tightness or abdominal pain.   Past Medical History:  Diagnosis Date  . Brain aneurysm 1995  . Diabetes mellitus without complication (HCC)   . Migraines   . Seizures (HCC)     There are no active problems to display for this patient.   Past Surgical History:  Procedure Laterality Date  . BRAIN SURGERY    . CHOLECYSTECTOMY    . PANCREATIC PSEUDOCYST DRAINAGE      Prior to Admission medications   Medication Sig Start Date End Date Taking? Authorizing Provider  amitriptyline (ELAVIL) 25 MG tablet Take 1 tablet by mouth daily. 12/17/16   [provider]  aspirin EC 81 MG tablet Take 81 mg by mouth daily.    [provider]  gabapentin (NEURONTIN) 800 MG tablet Take 800 mg by mouth 4  (four) times daily.    [provider]  glimepiride (AMARYL) 1 MG tablet Take 1 tablet by mouth daily. 11/15/16   [provider]  ibuprofen (ADVIL,MOTRIN) 600 MG tablet Take 1 tablet (600 mg total) by mouth every 6 (six) hours as needed. 05/22/17   Joni Reining, PA-C  lacosamide (VIMPAT) 50 MG TABS tablet Take 150 mg by mouth 2 (two) times daily.    [provider]  levETIRAcetam (KEPPRA XR) 500 MG 24 hr tablet Take 500 mg by mouth 2 (two) times daily.    [provider]  lisinopril (PRINIVIL,ZESTRIL) 5 MG tablet Take 1 tablet by mouth daily. 11/15/16   [provider]  metFORMIN (GLUCOPHAGE) 500 MG tablet Take 500 mg by mouth 2 (two) times daily with a meal.     [provider]  Multiple Vitamin (MULTI-VITAMINS) TABS Take 1 tablet by mouth daily.    [provider]  naproxen (NAPROSYN) 500 MG tablet Take 1 tablet (500 mg total) by mouth 2 (two) times daily with a meal. 10/14/17   Joni Reining, PA-C  oxyCODONE-acetaminophen (PERCOCET/ROXICET) 5-325 MG tablet Take 1 tablet by mouth every 8 (eight) hours as needed for up to 3 days for severe pain. 12/11/17 12/14/17  Orvil Feil, PA-C  pioglitazone (ACTOS) 30 MG tablet Take 1 tablet by mouth daily. 11/20/16   [provider]  propranolol (INDERAL) 80 MG tablet Take 80 mg by  mouth daily.     [provider]  SUMAtriptan (IMITREX) 100 MG tablet Take 100 mg by mouth every 2 (two) hours as needed for migraine.     [provider]  tiZANidine (ZANAFLEX) 4 MG tablet Take 2-8 mg by mouth See admin instructions. 2 mg every morning and 8 mg at bedtime    [provider]    Allergies Patient has no known allergies.  No family history on file.  Social History Social History   Tobacco Use  . Smoking status: Never Smoker  . Smokeless tobacco: Never Used  Substance Use Topics  . Alcohol use: No  . Drug use: No     Review of Systems   Constitutional: No fever/chills Eyes: No visual changes. No discharge ENT: No upper respiratory complaints. Cardiovascular: no chest pain. Respiratory: no cough. No SOB. Gastrointestinal: No abdominal pain.  No nausea, no vomiting.  No diarrhea.  No constipation. Genitourinary: Negative for dysuria. No hematuria Musculoskeletal: Patient has left elbow and left upper arm pain.  Skin: Patient has abrasion over let hand.  Neurological: Negative for headaches, focal weakness or numbness.   ____________________________________________   PHYSICAL EXAM:  VITAL SIGNS: ED Triage Vitals  Enc Vitals Group     BP 12/11/17 1454 (!) 158/91     Pulse Rate 12/11/17 1454 64     Resp 12/11/17 1454 16     Temp 12/11/17 1454 98.4 F (36.9 C)     Temp Source 12/11/17 1454 Oral     SpO2 12/11/17 1454 97 %     Weight 12/11/17 1455 200 lb (90.7 kg)     Height 12/11/17 1455 6\' 1"  (1.854 m)     Head Circumference --      Peak Flow --      Pain Score 12/11/17 1455 9     Pain Loc --      Pain Edu? --      Excl. in [place]? --      Constitutional: Alert and oriented. Well appearing and in no acute distress. Eyes: Conjunctivae are normal. PERRL. EOMI. Head: Atraumatic. ENT:      Ears: TMs are pearly.       Nose: No congestion/rhinnorhea.      Mouth/Throat: Mucous membranes are moist.  Neck: No stridor.  No cervical spine tenderness to palpation. Cardiovascular: Normal rate, regular rhythm. Normal S1 and S2.  Good peripheral circulation. Respiratory: Normal respiratory effort without tachypnea or retractions. Lungs CTAB. Good air entry to the bases with no decreased or absent breath sounds. Gastrointestinal: Large scars visualized from prior surgery to remove pseudocyst of pancreas.  Bowel sounds 4 quadrants. Soft and nontender to palpation. No guarding or rigidity. No palpable masses. No distention. No CVA tenderness. Musculoskeletal: Patient has range of motion deficits at the left shoulder which  is consistent with patient's baseline.  He reports that his pain is at the left elbow and left upper arm which is tender to palpation. Patient has pain with palpation over left lateral ribs.  Neurologic:  Normal speech and language. No gross focal neurologic deficits are appreciated.  Skin:  Skin is warm, dry and intact. No rash noted. Psychiatric: Mood and affect are normal. Speech and behavior are normal. Patient exhibits appropriate insight and judgement.   ____________________________________________   LABS (all labs ordered are listed, but only abnormal results are displayed)  Labs Reviewed - No data to display ____________________________________________  EKG   ____________________________________________  RADIOLOGY I personally viewed and evaluated these  images as part of my medical decision making, as well as reviewing the written report by the radiologist.     Dg Ribs Unilateral W/chest Left  Result Date: 12/11/2017 CLINICAL DATA:  Mechanical fall today.  LEFT rib pain. EXAM: LEFT RIBS AND CHEST - 3+ VIEW COMPARISON:  None. FINDINGS: Linear lucency lateral LEFT seventh rib seen on single view. Old distal LEFT clavicle and proximal humerus fractures. There is no evidence of pneumothorax or pleural effusion. Both lungs are clear. Heart size and mediastinal contours are within normal limits. Surgical clips in the included right abdomen compatible with cholecystectomy. IMPRESSION: Nondisplaced LEFT seventh rib fracture seen on single view. No acute cardiopulmonary process. Electronically Signed   By: Awilda Metro M.D.   On: 12/11/2017 17:22   Dg Elbow Complete Left  Result Date: 12/11/2017 CLINICAL DATA:  left elbow pain EXAM: LEFT ELBOW - COMPLETE 3+ VIEW COMPARISON:  05/24/2017. FINDINGS: No acute bony or joint abnormality identified. No evidence of fracture or dislocation. Diffuse mild degenerative change. IMPRESSION: Diffuse mild degenerative change.  Exam otherwise  unremarkable. Electronically Signed   By: Maisie Fus  Register   On: 12/11/2017 17:24   Ct Head Wo Contrast  Result Date: 12/11/2017 CLINICAL DATA:  Head trauma, mechanical fall today landing on sidewalk on LEFT side, denies loss of consciousness and hitting head, remote history of brain aneurysm, diabetes mellitus EXAM: CT HEAD WITHOUT CONTRAST CT CERVICAL SPINE WITHOUT CONTRAST TECHNIQUE: Multidetector CT imaging of the head and cervical spine was performed following the standard protocol without intravenous contrast. Multiplanar CT image reconstructions of the cervical spine were also generated. COMPARISON:  10/20/2013 FINDINGS: CT HEAD FINDINGS Brain: Large area of encephalomalacia involving the RIGHT MCA territory at the temporal, frontal and parietal lobes from remote infarct. Generalized atrophy. Ex vacuo dilatation of the RIGHT lateral ventricle. No midline shift or mass effect. Brain parenchyma otherwise normal in appearance. No intracranial hemorrhage, mass lesion or evidence of acute infarction. No extra-axial fluid collections. Vascular: No hyperdense vessels. Skull: Prior RIGHT temporoparietal craniotomy. Calvaria otherwise intact. Sinuses/Orbits: Visualized paranasal sinuses, mastoid air cells and middle ear cavities clear Other: N/A CT CERVICAL SPINE FINDINGS Alignment: Normal Skull base and vertebrae: Osseous mineralization normal. Skull base intact. Disc space narrowing and endplate spur formation C4-C5. Minimal degenerative facet disease changes. No fracture, subluxation or bone destruction. Soft tissues and spinal canal: Prevertebral soft tissues normal thickness. Remaining visualized soft tissues unremarkable. Disc levels: Minimally bulging discs C4-C5. Otherwise unremarkable. Upper chest: Tips of lung apices clear. 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 No acute intracranial abnormalities. Minimal degenerative disc and facet disease  changes of the cervical spine. No acute cervical spine abnormalities. Electronically Signed   By: Ulyses Southward M.D.   On: 12/11/2017 16:23   Ct Cervical Spine Wo Contrast  Result Date: 12/11/2017 CLINICAL DATA:  Head trauma, mechanical fall today landing on sidewalk on LEFT side, denies loss of consciousness and hitting head, remote history of brain aneurysm, diabetes mellitus EXAM: CT HEAD WITHOUT CONTRAST CT CERVICAL SPINE WITHOUT CONTRAST TECHNIQUE: Multidetector CT imaging of the head and cervical spine was performed following the standard protocol without intravenous contrast. Multiplanar CT image reconstructions of the cervical spine were also generated. COMPARISON:  10/20/2013 FINDINGS: CT HEAD FINDINGS Brain: Large area of encephalomalacia involving the RIGHT MCA territory at the temporal, frontal and parietal lobes from remote infarct. Generalized atrophy. Ex vacuo dilatation of the RIGHT lateral ventricle. No midline shift or  mass effect. Brain parenchyma otherwise normal in appearance. No intracranial hemorrhage, mass lesion or evidence of acute infarction. No extra-axial fluid collections. Vascular: No hyperdense vessels. Skull: Prior RIGHT temporoparietal craniotomy. Calvaria otherwise intact. Sinuses/Orbits: Visualized paranasal sinuses, mastoid air cells and middle ear cavities clear Other: N/A CT CERVICAL SPINE FINDINGS Alignment: Normal Skull base and vertebrae: Osseous mineralization normal. Skull base intact. Disc space narrowing and endplate spur formation C4-C5. Minimal degenerative facet disease changes. No fracture, subluxation or bone destruction. Soft tissues and spinal canal: Prevertebral soft tissues normal thickness. Remaining visualized soft tissues unremarkable. Disc levels: Minimally bulging discs C4-C5. Otherwise unremarkable. Upper chest: Tips of lung apices clear. 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 No acute  intracranial abnormalities. Minimal degenerative disc and facet disease changes of the cervical spine. No acute cervical spine abnormalities. Electronically Signed   By: Ulyses Southward M.D.   On: 12/11/2017 16:23   Dg Humerus Left  Result Date: 12/11/2017 CLINICAL DATA:  Mechanical fall today.  LEFT arm and rib pain. EXAM: LEFT HUMERUS - 2+ VIEW COMPARISON:  LEFT humerus radiograph October 16, 2016 FINDINGS: Linear lucency through distal humerus favored as artifact, not present on dedicated elbow radiograph from same day, reported separately. Old proximal humerus fracture with superimposed slight cortical offset. No destructive bony lesions. Soft tissue planes are non suspicious. IMPRESSION: 1. Old proximal LEFT humerus fracture with slight cortical offset, recommend correlation with point tenderness. No dislocation. Electronically Signed   By: Awilda Metro M.D.   On: 12/11/2017 17:20    ____________________________________________    PROCEDURES  Procedure(s) performed:    Procedures    Medications  oxyCODONE-acetaminophen (PERCOCET/ROXICET) 5-325 MG per tablet 1 tablet (1 tablet Oral Given 12/11/17 1759)     ____________________________________________   INITIAL IMPRESSION / ASSESSMENT AND PLAN / ED COURSE  Pertinent labs & imaging results that were available during my care of the patient were reviewed by me and considered in my medical decision making (see chart for details).  Review of the [place] CSRS was performed in accordance of the [facility] prior to dispensing any controlled drugs.    Assessment and Plan:  Fall  Differential diagnosis included intracranial bleed, skull fracture, C-spine fracture, fracture of the upper extremity, rib fracture and pneumothorax Patient presents to the emergency department with a mechanical, non-syncopal fall.  Patient was tripped.  He complained primarily of left lateral rib pain and proximal humerus pain.  Given patient's prior brain aneurysm in  1995, CT head and cervical spine were obtained which revealed no evidence of intracranial hemorrhage or cervical spine fracture.  There was some cortical disruption along the proximal humerus.  Patient related that he has had prior proximal humerus fractures in the past and the area is tender but not exceptionally painful.  There is some suspicion for hairline fracture.  Patient was placed in a sling in the emergency department.  A nondisplaced seventh rib fracture was identified on x-ray of the chest.  No evidence of pneumothorax.  Patient was discharged with a short course of Roxicet and patient was advised not to drive or operate heavy machinery with medication.  Patient voiced understanding.  He has taken Percocet in the past without adverse reaction.  Vital signs were reassuring prior to discharge.  All patient questions were answered.   ____________________________________________  FINAL CLINICAL IMPRESSION(S) / ED DIAGNOSES  Final diagnoses:  Fall, initial encounter  Closed fracture of one rib of left side, initial encounter  NEW MEDICATIONS STARTED DURING THIS VISIT:  ED Discharge Orders         Ordered    oxyCODONE-acetaminophen (PERCOCET/ROXICET) 5-325 MG tablet  Every 8 hours PRN     12/11/17 1748              This chart was dictated using voice recognition software/Dragon. Despite best efforts to proofread, errors can occur which can change the meaning. Any change was purely unintentional. 0   Orvil Feil, [place] 12/11/17 1821    Minna Antis, MD 12/11/17 2257

## 2017-12-11 NOTE — ED Triage Notes (Signed)
Tripped on curb and fell on left side.  Says left upper arm, left ribs hurt.  Has abrasion to left hand.
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
## 2018-08-08 IMAGING — CR DG RIBS W/ CHEST 3+V*L*
1 series · 3 of 3 positions shown · non-contrast
Comparison: Radiographs April 25, 2017.

CLINICAL DATA: Left rib pain.

EXAM:
LEFT RIBS AND CHEST - 3+ VIEW

[Series 1: dg ribs unilateral w/chest left · 0.14mm/px · 3 of 3 slices shown]
[im 1/3]
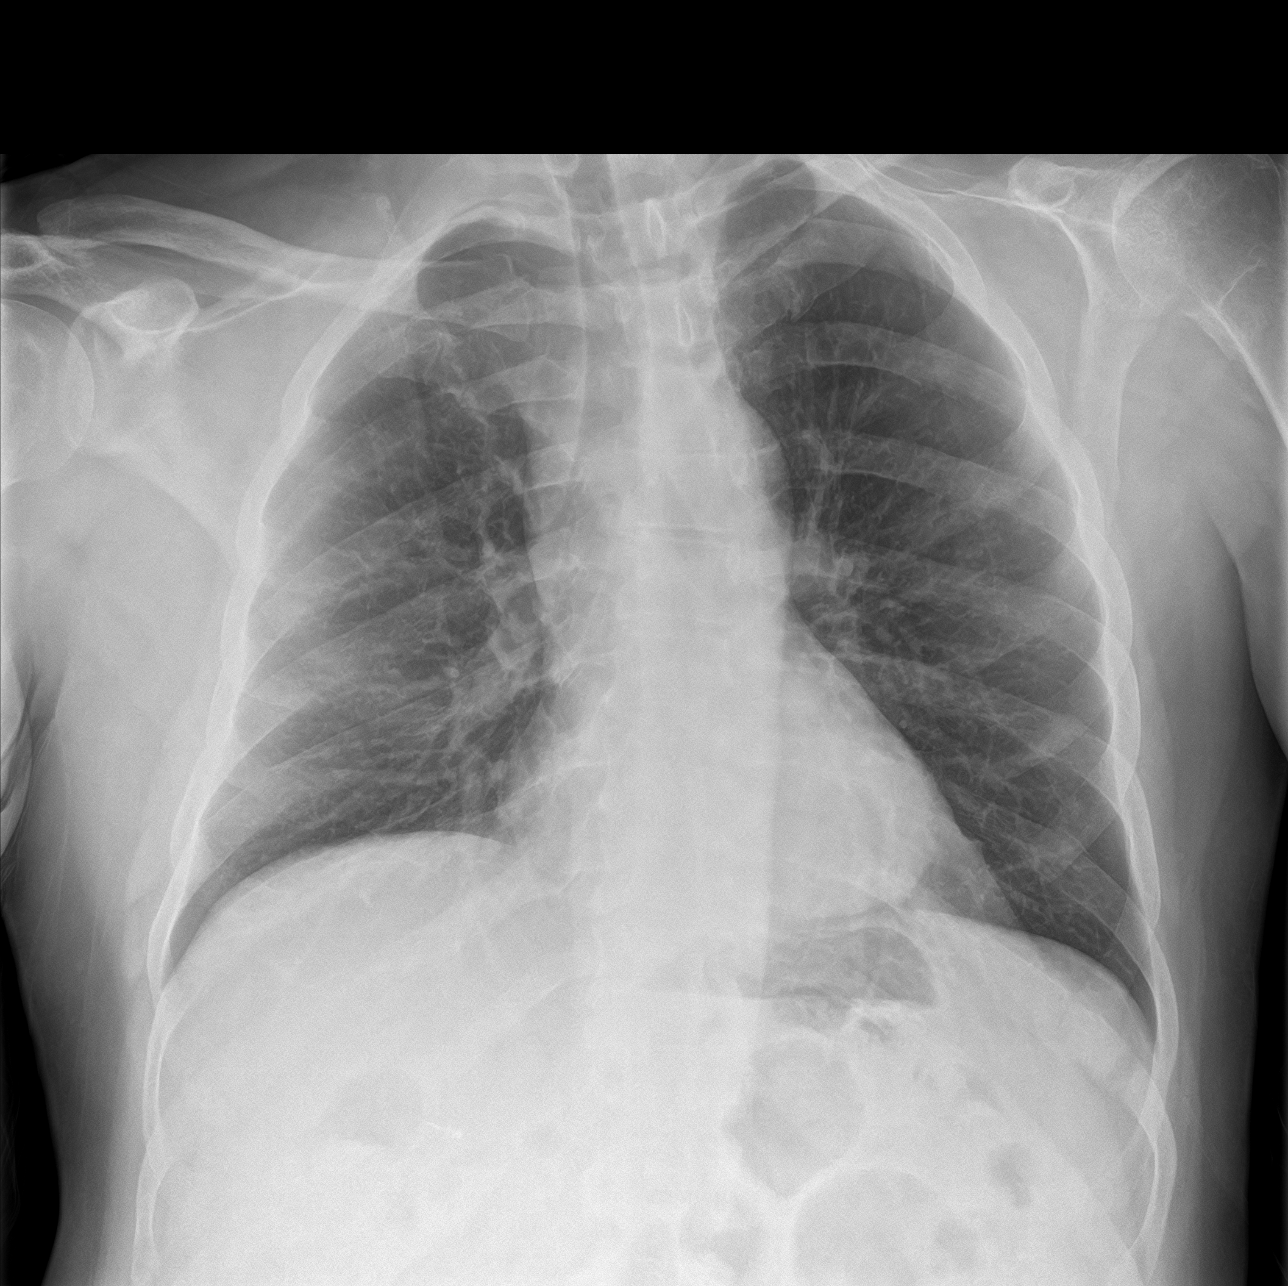
[im 2/3]
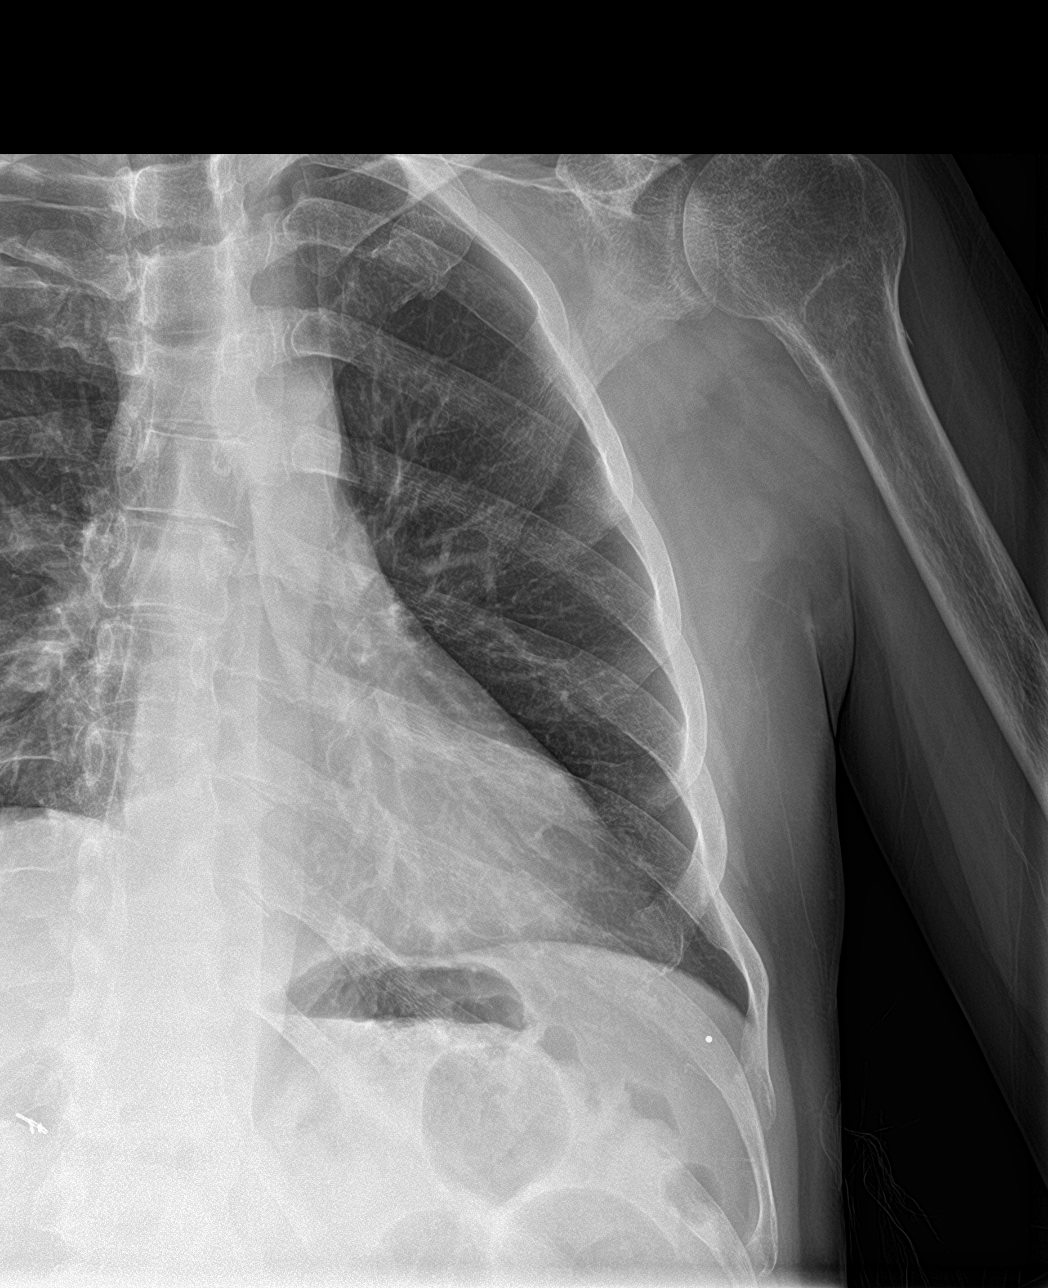
[im 3/3]
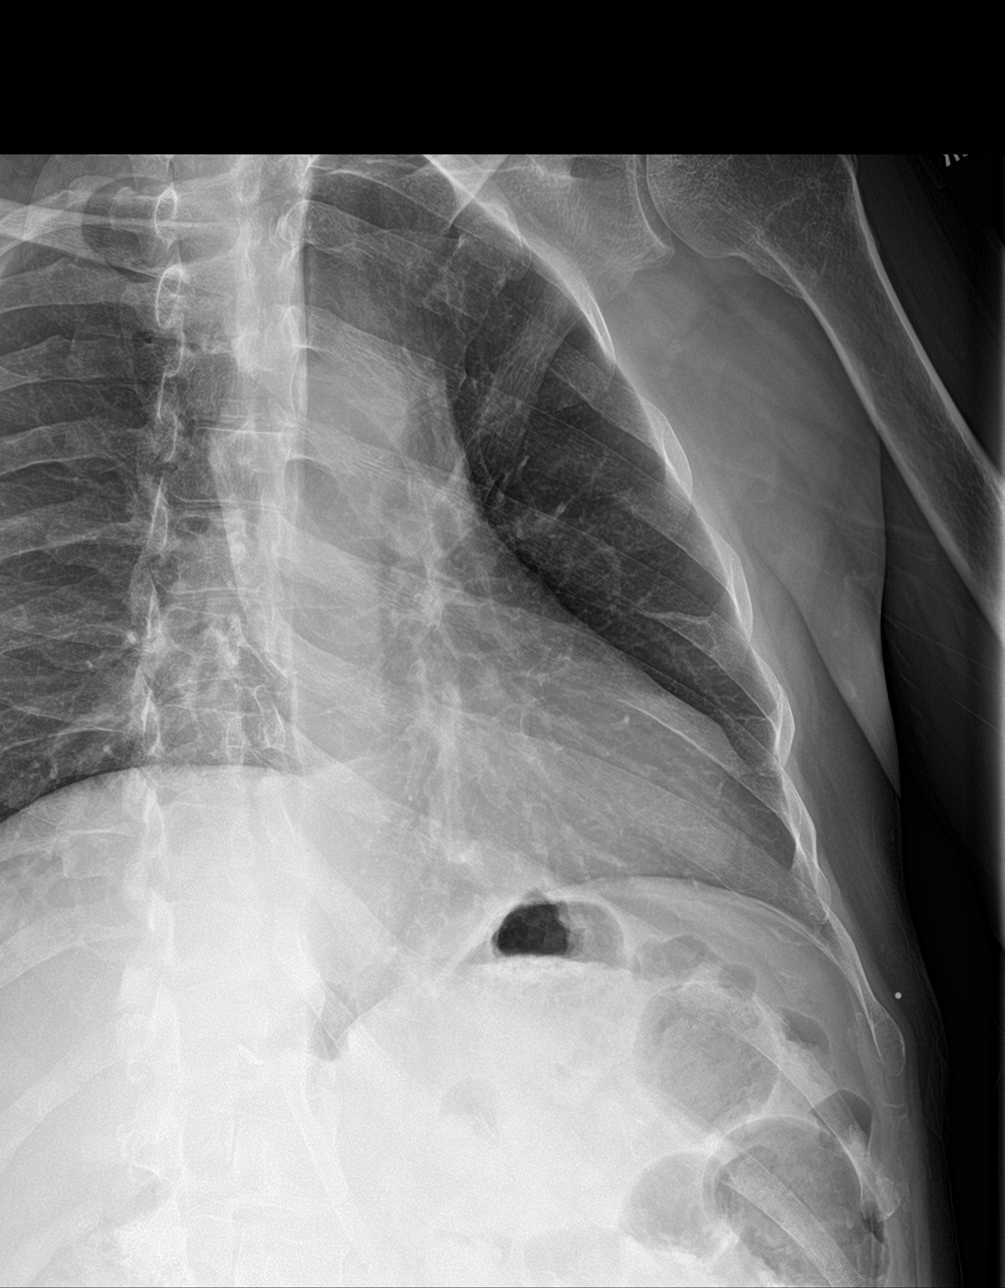

[3 of 3 positions shown; findings below may reference images not displayed]

FINDINGS: Possible nondisplaced fractures are noted involving the
posterolateral portions of the left seventh and eighth ribs. There
is no evidence of pneumothorax or pleural effusion. Both lungs are
clear. Heart size and mediastinal contours are within normal limits.
IMPRESSION: Possible nondisplaced left seventh and eighth rib fractures. No
acute cardiopulmonary abnormality seen.

## 2018-10-24 ENCOUNTER — Other Ambulatory Visit: Admission: RE | Admit: 2018-10-24 | Payer: Medicare PPO | Source: Ambulatory Visit

## 2018-10-28 ENCOUNTER — Ambulatory Visit: Admission: RE | Admit: 2018-10-28 | Payer: Medicare PPO | Source: Home / Self Care | Admitting: Internal Medicine

## 2018-10-28 ENCOUNTER — Encounter: Admission: RE | Payer: Self-pay | Source: Home / Self Care

## 2018-10-28 SURGERY — COLONOSCOPY WITH PROPOFOL
Anesthesia: General

## 2019-04-12 ENCOUNTER — Encounter: Payer: Self-pay | Admitting: Emergency Medicine

## 2019-04-12 ENCOUNTER — Emergency Department: Payer: Medicare PPO

## 2019-04-12 ENCOUNTER — Other Ambulatory Visit: Payer: Self-pay

## 2019-04-12 ENCOUNTER — Emergency Department
Admission: EM | Admit: 2019-04-12 | Discharge: 2019-04-12 | Disposition: A | Discharge status: Home / Self Care | Payer: Medicare PPO | Attending: Emergency Medicine | Admitting: Emergency Medicine

## 2019-04-12 DIAGNOSIS — S60512A Abrasion of left hand, initial encounter: Secondary | ICD-10-CM | POA: Diagnosis not present

## 2019-04-12 DIAGNOSIS — W010XXA Fall on same level from slipping, tripping and stumbling without subsequent striking against object, initial encounter: Secondary | ICD-10-CM | POA: Diagnosis not present

## 2019-04-12 DIAGNOSIS — Y999 Unspecified external cause status: Secondary | ICD-10-CM | POA: Diagnosis not present

## 2019-04-12 DIAGNOSIS — Z23 Encounter for immunization: Secondary | ICD-10-CM | POA: Insufficient documentation

## 2019-04-12 DIAGNOSIS — Y929 Unspecified place or not applicable: Secondary | ICD-10-CM | POA: Diagnosis not present

## 2019-04-12 DIAGNOSIS — Y939 Activity, unspecified: Secondary | ICD-10-CM | POA: Insufficient documentation

## 2019-04-12 DIAGNOSIS — S6992XA Unspecified injury of left wrist, hand and finger(s), initial encounter: Secondary | ICD-10-CM | POA: Diagnosis present

## 2019-04-12 DIAGNOSIS — Z7984 Long term (current) use of oral hypoglycemic drugs: Secondary | ICD-10-CM | POA: Insufficient documentation

## 2019-04-12 DIAGNOSIS — S60222A Contusion of left hand, initial encounter: Secondary | ICD-10-CM

## 2019-04-12 DIAGNOSIS — Z7982 Long term (current) use of aspirin: Secondary | ICD-10-CM | POA: Diagnosis not present

## 2019-04-12 DIAGNOSIS — E119 Type 2 diabetes mellitus without complications: Secondary | ICD-10-CM | POA: Diagnosis not present

## 2019-04-12 DIAGNOSIS — Z79899 Other long term (current) drug therapy: Secondary | ICD-10-CM | POA: Diagnosis not present

## 2019-04-12 MED ORDER — CEPHALEXIN 500 MG PO CAPS: 500.0000 mg | ORAL_CAPSULE | Freq: Three times a day (TID) | ORAL | 0 refills | Status: AC

## 2019-04-12 MED ORDER — TETANUS-DIPHTH-ACELL PERTUSSIS 5-2.5-18.5 LF-MCG/0.5 IM SUSP
0.5000 mL | Freq: Once | INTRAMUSCULAR | Status: AC
  Administered 2019-04-12: 0.5 mL via INTRAMUSCULAR
  Filled 2019-04-12: qty 0.5

## 2019-04-12 MED ORDER — CEPHALEXIN 500 MG PO CAPS
500.0000 mg | ORAL_CAPSULE | Freq: Once | ORAL | Status: AC
  Administered 2019-04-12: 500 mg via ORAL
  Filled 2019-04-12: qty 1

## 2019-04-12 NOTE — ED Provider Notes (Signed)
[facility] Emergency Department Provider Note ____________________________________________  Time seen: 1936  I have reviewed the triage vital signs and the nursing notes.  HISTORY  Chief Complaint  Hand Pain  HPI Charles KOSIK Bryan is a 54 y.o. male with a history of seizures, diabetes, brain aneurysm, presents himself  to the ED for evaluation of injury to his left hand following mechanical fall.  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 Patient describes the incident occurred yesterday, and he denied it on an outstretched left hand.  He presents with bleeding controlled to an open wound to the dorsal hand at this time.  He also complains of some swelling and pain to the left hand.  He denies any other injury at this time.  Past Medical History:  Diagnosis Date  . Brain aneurysm 1995  . Diabetes mellitus without complication (HCC)   . Migraines   . Seizures (HCC)     There are no problems to display for this patient.   Past Surgical History:  Procedure Laterality Date  . BRAIN SURGERY    . CHOLECYSTECTOMY    . PANCREATIC PSEUDOCYST DRAINAGE      Prior to Admission medications   Medication Sig Start Date End Date Taking? Authorizing Provider  amitriptyline (ELAVIL) 25 MG tablet Take 1 tablet by mouth daily. 12/17/16   [provider]  aspirin EC 81 MG tablet Take 81 mg by mouth daily.    [provider]  cephALEXin (KEFLEX) 500 MG capsule Take 1 capsule (500 mg total) by mouth 3 (three) times daily for 7 days. 04/12/19 04/19/19  Saliah Crisp, Charlesetta Ivory, PA-C  gabapentin (NEURONTIN) 800 MG tablet Take 800 mg by mouth 4 (four) times daily.    [provider]  glimepiride (AMARYL) 1 MG tablet Take 1 tablet by mouth daily. 11/15/16   [provider]  ibuprofen (ADVIL,MOTRIN) 600 MG tablet Take 1 tablet (600 mg total) by mouth every 6 (six) hours as needed. 05/22/17   Joni Reining, PA-C  lacosamide  (VIMPAT) 50 MG TABS tablet Take 150 mg by mouth 2 (two) times daily.    [provider]  levETIRAcetam (KEPPRA XR) 500 MG 24 hr tablet Take 500 mg by mouth 2 (two) times daily.    [provider]  lisinopril (PRINIVIL,ZESTRIL) 5 MG tablet Take 1 tablet by mouth daily. 11/15/16   [provider]  metFORMIN (GLUCOPHAGE) 500 MG tablet Take 500 mg by mouth 2 (two) times daily with a meal.     [provider]  Multiple Vitamin (MULTI-VITAMINS) TABS Take 1 tablet by mouth daily.    [provider]  naproxen (NAPROSYN) 500 MG tablet Take 1 tablet (500 mg total) by mouth 2 (two) times daily with a meal. 10/14/17   Joni Reining, PA-C  pioglitazone (ACTOS) 30 MG tablet Take 1 tablet by mouth daily. 11/20/16   [provider]  propranolol (INDERAL) 80 MG tablet Take 80 mg by mouth daily.     [provider]  SUMAtriptan (IMITREX) 100 MG tablet Take 100 mg by mouth every 2 (two) hours as needed for migraine.     [provider]  tiZANidine (ZANAFLEX) 4 MG tablet Take 2-8 mg by mouth See admin instructions. 2 mg every morning and 8 mg at bedtime    [provider]    Allergies Patient has no known allergies.  History reviewed. No pertinent family history.  Social History Social History  Tobacco Use  . Smoking status: Never Smoker  . Smokeless tobacco: Never Used  Substance Use Topics  . Alcohol use: No  . Drug use: No    Review of Systems  Constitutional: Negative for fever. Cardiovascular: Negative for chest pain. Respiratory: Negative for shortness of breath. Musculoskeletal: Negative for back pain.  Left hand pain as above. Skin: Negative for rash.  Left hand wound is noted. Neurological: Negative for headaches, focal weakness or numbness. ____________________________________________  PHYSICAL EXAM:  VITAL SIGNS: ED Triage Vitals  Enc Vitals Group     BP 04/12/19 1830 (!) 149/72     Pulse Rate  04/12/19 1830 (!) 59     Resp 04/12/19 1830 18     Temp 04/12/19 1830 98.2 F (36.8 C)     Temp Source 04/12/19 1830 Oral     SpO2 04/12/19 1830 100 %     Weight 04/12/19 1826 200 lb (90.7 kg)     Height 04/12/19 1826 6\' 1"  (1.854 m)     Head Circumference --      Peak Flow --      Pain Score 04/12/19 1826 8     Pain Loc --      Pain Edu? --      Excl. in [place]? --     Constitutional: Alert and oriented. Well appearing and in no distress. Head: Normocephalic and atraumatic. Eyes: Conjunctivae are normal. Normal extraocular movements Cardiovascular: Normal rate, regular rhythm. Normal distal pulses. Respiratory: Normal respiratory effort. No wheezes/rales/rhonchi. Musculoskeletal: Left hand with a dorsal abrasion over the CP.  Patient with some mild swelling to the hand given his chronic contracture.  He is able to passively extend the fingers without difficulty.  No deformity is appreciated.  Nontender with normal range of motion in all extremities.  Neurologic:  Normal gross sensation.  Normal speech and language. No gross focal neurologic deficits are appreciated. Skin:  Skin is warm, dry and intact. No rash noted. ____________________________________________   RADIOLOGY  DG Left Hand IMPRESSION: Limited exam.  No definite acute osseous abnormality.  I, Melvenia Needles, personally viewed and evaluated these images (plain radiographs) as part of my medical decision making, as well as reviewing the written report by the radiologist. ____________________________________________  PROCEDURES  Tdap 0.5 ml IM Wound care.  Procedures ____________________________________________  INITIAL IMPRESSION / ASSESSMENT AND PLAN / ED COURSE  Patient with ED evaluation of a mechanical fall resulting in an abrasion and contusion to the left hand.  Patient was concerned because he has a chronic contracture deformity to the left hand.  His x-ray does not reveal any obvious fracture or  dislocation.  He has a abrasion over the knuckle of the fifth MCP.  That wound is cleansed and dressed with a nonstick dressing.  Patient is discharged with a prescription for Keflex for wound prophylaxis.  He will follow up with his primary provider for ongoing symptoms.  Return precautions have been reviewed.  TRANELL WOJTKIEWICZ Bryan was evaluated in Emergency Department on 04/12/2019 for the symptoms described in the history of present illness. He was evaluated in the context of the global COVID-19 pandemic, which necessitated consideration that the patient might be at risk for infection with the SARS-CoV-2 virus that causes COVID-19. Institutional protocols and algorithms that pertain to the evaluation of patients at risk for COVID-19 are in a state of rapid change based on information released by regulatory bodies including the CDC and federal and state organizations. These policies and algorithms  were followed during the patient's care in the ED. ____________________________________________  FINAL CLINICAL IMPRESSION(S) / ED DIAGNOSES  Final diagnoses:  Contusion of left hand, initial encounter  Abrasion of left hand, initial encounter      Lissa Hoard, PA-C 04/12/19 2111    Phineas Semen, MD 04/12/19 2241

## 2019-04-12 NOTE — ED Triage Notes (Signed)
Pt presents to ED via POV, pt states mechanical fall yesterday and hurt L hand. Pt with open wound to L hand, bleeding controlled at this time, pt c/o swelling and pain to L hand at this time.

## 2019-04-12 NOTE — Discharge Instructions (Addendum)
Keep the wound clean, dry, and covered. Apply ice packs to reduce swelling. Follow-up with your provider for ongoing symptoms.

## 2019-04-23 ENCOUNTER — Ambulatory Visit: Payer: Medicare PPO | Attending: Internal Medicine

## 2019-04-23 DIAGNOSIS — Z23 Encounter for immunization: Secondary | ICD-10-CM

## 2019-04-23 NOTE — Progress Notes (Signed)
   Covid-19 Vaccination Clinic  Name:  Charles Bryan    MRN: [id number] DOB: 1965-07-08  04/23/2019  Charles Bryan was observed post Covid-19 immunization for 15 minutes without incident. He was provided with Vaccine Information Sheet and instruction to access the V-Safe system.   Charles Bryan was instructed to call 911 with any severe reactions post vaccine: Marland Kitchen Difficulty breathing  . Swelling of face and throat  . A fast heartbeat  . A bad rash all over body  . Dizziness and weakness   Immunizations Administered    Name Date Dose VIS Date Route   Pfizer COVID-19 Vaccine 04/23/2019 11:25 AM 0.3 mL 01/16/2019 Intramuscular   Manufacturer: [employer], [employer]   Lot: [id number]   NDC: [address]-2

## 2019-05-19 ENCOUNTER — Ambulatory Visit: Payer: Medicare PPO

## 2019-05-20 ENCOUNTER — Ambulatory Visit: Payer: Medicare PPO | Attending: Internal Medicine

## 2019-05-20 DIAGNOSIS — Z23 Encounter for immunization: Secondary | ICD-10-CM

## 2019-05-20 NOTE — Progress Notes (Signed)
   Covid-19 Vaccination Clinic  Name:  Charles Bryan    MRN: [id number] DOB: 08-Sep-1965  05/20/2019  [name] was observed post Covid-19 immunization for 15 minutes without incident. He was provided with Vaccine Information Sheet and instruction to access the V-Safe system.   [name] was instructed to call 911 with any severe reactions post vaccine: Marland Kitchen Difficulty breathing  . Swelling of face and throat  . A fast heartbeat  . A bad rash all over body  . Dizziness and weakness   Immunizations Administered    Name Date Dose VIS Date Route   Pfizer COVID-19 Vaccine 05/20/2019  8:56 AM 0.3 mL 01/16/2019 Intramuscular   Manufacturer: [employer], [employer]   Lot: [id number]   NDC: [address]-5

## 2019-09-05 ENCOUNTER — Emergency Department
Admission: EM | Admit: 2019-09-05 | Discharge: 2019-09-05 | Disposition: A | Discharge status: Home / Self Care | Payer: Medicare PPO | Attending: Emergency Medicine | Admitting: Emergency Medicine

## 2019-09-05 ENCOUNTER — Encounter: Payer: Self-pay | Admitting: *Deleted

## 2019-09-05 ENCOUNTER — Other Ambulatory Visit: Payer: Self-pay

## 2019-09-05 ENCOUNTER — Emergency Department: Payer: Medicare PPO

## 2019-09-05 DIAGNOSIS — S5002XA Contusion of left elbow, initial encounter: Secondary | ICD-10-CM | POA: Insufficient documentation

## 2019-09-05 DIAGNOSIS — Z79899 Other long term (current) drug therapy: Secondary | ICD-10-CM | POA: Insufficient documentation

## 2019-09-05 DIAGNOSIS — Y999 Unspecified external cause status: Secondary | ICD-10-CM | POA: Insufficient documentation

## 2019-09-05 DIAGNOSIS — Y929 Unspecified place or not applicable: Secondary | ICD-10-CM | POA: Insufficient documentation

## 2019-09-05 DIAGNOSIS — Z7982 Long term (current) use of aspirin: Secondary | ICD-10-CM | POA: Insufficient documentation

## 2019-09-05 DIAGNOSIS — S59902A Unspecified injury of left elbow, initial encounter: Secondary | ICD-10-CM | POA: Diagnosis present

## 2019-09-05 DIAGNOSIS — W19XXXA Unspecified fall, initial encounter: Secondary | ICD-10-CM

## 2019-09-05 DIAGNOSIS — Y939 Activity, unspecified: Secondary | ICD-10-CM | POA: Diagnosis not present

## 2019-09-05 DIAGNOSIS — E119 Type 2 diabetes mellitus without complications: Secondary | ICD-10-CM | POA: Insufficient documentation

## 2019-09-05 DIAGNOSIS — W010XXA Fall on same level from slipping, tripping and stumbling without subsequent striking against object, initial encounter: Secondary | ICD-10-CM | POA: Insufficient documentation

## 2019-09-05 DIAGNOSIS — Z7984 Long term (current) use of oral hypoglycemic drugs: Secondary | ICD-10-CM | POA: Insufficient documentation

## 2019-09-05 MED ORDER — TRAMADOL HCL 50 MG PO TABS: 50.0000 mg | ORAL_TABLET | Freq: Four times a day (QID) | ORAL | 0 refills | Status: DC | PRN

## 2019-09-05 NOTE — ED Triage Notes (Signed)
PT slipped and fell on his left elbow last night. Significant swelling to the left elbow with decreased movement. Pt has decreased movement at baseline due to a brain aneurysm and reports he can not tell if it is even worse than normal.

## 2019-09-05 NOTE — ED Provider Notes (Signed)
[facility] Emergency Department Provider Note   ____________________________________________   First MD Initiated Contact with Patient 09/05/19 0745     (approximate)  I have reviewed the triage vital signs and the nursing notes.   HISTORY  Chief Complaint Arm Swelling    HPI Charles Bryan is a 54 y.o. male presents to the ED with complaint of left elbow pain.  Patient states that he slipped in some grease last night and fell on his left elbow.  Patient states that there was significant swelling to his elbow with decreased movement.  He reports that he put a pack of frozen peas on his elbow and the swelling has improved greatly.  He denies any other injuries.  He reports that he landed directly on his elbow.  He rates his pain as 7 out of 10.         Past Medical History:  Diagnosis Date  . Brain aneurysm 1995  . Diabetes mellitus without complication (HCC)   . Migraines   . Seizures (HCC)     There are no problems to display for this patient.   Past Surgical History:  Procedure Laterality Date  . BRAIN SURGERY    . CHOLECYSTECTOMY    . PANCREATIC PSEUDOCYST DRAINAGE      Prior to Admission medications   Medication Sig Start Date End Date Taking? Authorizing Provider  amitriptyline (ELAVIL) 25 MG tablet Take 1 tablet by mouth daily. 12/17/16   [provider]  aspirin EC 81 MG tablet Take 81 mg by mouth daily.    [provider]  gabapentin (NEURONTIN) 800 MG tablet Take 800 mg by mouth 4 (four) times daily.    [provider]  glimepiride (AMARYL) 1 MG tablet Take 1 tablet by mouth daily. 11/15/16   [provider]  ibuprofen (ADVIL,MOTRIN) 600 MG tablet Take 1 tablet (600 mg total) by mouth every 6 (six) hours as needed. 05/22/17   Joni Reining, PA-C  lacosamide (VIMPAT) 50 MG TABS tablet Take 150 mg by mouth 2 (two) times daily.    [provider]  levETIRAcetam (KEPPRA XR) 500 MG 24  hr tablet Take 500 mg by mouth 2 (two) times daily.    [provider]  lisinopril (PRINIVIL,ZESTRIL) 5 MG tablet Take 1 tablet by mouth daily. 11/15/16   [provider]  metFORMIN (GLUCOPHAGE) 500 MG tablet Take 500 mg by mouth 2 (two) times daily with a meal.     [provider]  Multiple Vitamin (MULTI-VITAMINS) TABS Take 1 tablet by mouth daily.    [provider]  naproxen (NAPROSYN) 500 MG tablet Take 1 tablet (500 mg total) by mouth 2 (two) times daily with a meal. 10/14/17   Joni Reining, PA-C  pioglitazone (ACTOS) 30 MG tablet Take 1 tablet by mouth daily. 11/20/16   [provider]  propranolol (INDERAL) 80 MG tablet Take 80 mg by mouth daily.     [provider]  SUMAtriptan (IMITREX) 100 MG tablet Take 100 mg by mouth every 2 (two) hours as needed for migraine.     [provider]  tiZANidine (ZANAFLEX) 4 MG tablet Take 2-8 mg by mouth See admin instructions. 2 mg every morning and 8 mg at bedtime    [provider]  traMADol (ULTRAM) 50 MG tablet Take 1 tablet (50 mg total) by mouth every 6 (six) hours as needed. 09/05/19   Tommi Rumps, PA-C    Allergies  Patient has no known allergies.  History reviewed. No pertinent family history.  Social History Social History   Tobacco Use  . Smoking status: Never Smoker  . Smokeless tobacco: Never Used  Vaping Use  . Vaping Use: Never used  Substance Use Topics  . Alcohol use: No  . Drug use: No    Review of Systems Constitutional: No fever/chills Eyes: No visual changes. ENT: No trauma. Cardiovascular: Denies chest pain. Respiratory: Denies shortness of breath. Gastrointestinal:   No nausea, no vomiting.   Musculoskeletal: Positive for left elbow pain. Skin: Positive for abrasion. Neurological: Positive weakness left upper extremity secondary to brain aneurysm and patient states that he has decreased movement which is his  baseline. ____________________________________________   PHYSICAL EXAM:  VITAL SIGNS: ED Triage Vitals [09/05/19 0651]  Enc Vitals Group     BP (!) 132/81     Pulse Rate 66     Resp 16     Temp 98.6 F (37 C)     Temp Source Oral     SpO2 97 %     Weight 190 lb (86.2 kg)     Height 6\' 1"  (1.854 m)     Head Circumference      Peak Flow      Pain Score 7     Pain Loc      Pain Edu?      Excl. in GC?     Constitutional: Alert and oriented. Well appearing and in no acute distress. Eyes: Conjunctivae are normal.  Head: Atraumatic. Nose: No trauma. Neck: No stridor.   Cardiovascular: Normal rate, regular rhythm. Grossly normal heart sounds.  Good peripheral circulation. Respiratory: Normal respiratory effort.  No retractions. Lungs CTAB. Musculoskeletal: Left olecranon with soft tissue edema.  Patient is able to minimally flex and extend but states this is his normal baseline full range of motion in this left extremity.  There is a superficial abrasion noted to the lateral epicondylar area without active bleeding or foreign body.  Pulses present distally. Neurologic:  Normal speech and language. No gross focal neurologic deficits are appreciated. No gait instability. Skin:  Skin is warm, dry. Psychiatric: Mood and affect are normal. Speech and behavior are normal.  ____________________________________________   LABS (all labs ordered are listed, but only abnormal results are displayed)  Labs Reviewed - No data to display  RADIOLOGY  Official radiology report(s): DG Elbow Complete Left  Result Date: 09/05/2019 CLINICAL DATA:  Patient states falling yesterday and landing on left arm. Swelling to elbow. Patient states he has limited range of motion to left arm due to brain aneurysm. EXAM: LEFT ELBOW - COMPLETE 3+ VIEW COMPARISON:  None. FINDINGS: No fracture or bone lesion. Elbow joint normally spaced and aligned. No joint effusion. Posterior soft tissue swelling. IMPRESSION:  1. No fracture or elbow joint abnormality. 2. Posterior soft tissue swelling. Electronically Signed   By: 09/07/2019 M.D.   On: 09/05/2019 08:44    ____________________________________________   PROCEDURES  Procedure(s) performed (including Critical Care):  Procedures   ____________________________________________   INITIAL IMPRESSION / ASSESSMENT AND PLAN / ED COURSE  As part of my medical decision making, I reviewed the following data within the electronic MEDICAL RECORD NUMBER Notes from prior ED visits and [place] Controlled Substance Database  54 year old male presents to the ED with complaint of left elbow pain after falling last evening on some grease.  Patient states that he did not hit his head and there was no loss of  consciousness.  He did have a significant amount of swelling however this is improved with a pack of frozen peas.  States that on a regular basis he has decreased movement to his left upper extremity due to a brain aneurysm in the past.  There continues to be soft tissue swelling around the olecranon and an abrasion to the lateral epicondylar area.  X-rays were negative for acute bony injury and patient was made aware.  He is reassured.  Patient will continue to ice and elevate and follow-up with his PCP if any continued problems.  ____________________________________________   FINAL CLINICAL IMPRESSION(S) / ED DIAGNOSES  Final diagnoses:  Contusion of left elbow, initial encounter  Fall, initial encounter     ED Discharge Orders         Ordered    traMADol (ULTRAM) 50 MG tablet  Every 6 hours PRN     Discontinue  Reprint     09/05/19 0914           Note:  This document was prepared using Dragon voice recognition software and may include unintentional dictation errors.    Tommi Rumps, PA-C 09/05/19 1215    Delton Prairie, MD 09/05/19 [phone]

## 2019-09-05 NOTE — Discharge Instructions (Addendum)
Follow-up with your primary care provider if any continued problems.  Continue to ice and elevate when possible.  If you have your sling you may need to wear it for a couple of days just for support and protection.  Continue taking your regular medication.  Also tramadol was sent to your pharmacy to take if needed for pain.

## 2019-09-05 NOTE — ED Notes (Signed)
See triage note  Presents with left elbow pain  States he slipped in some grease   Landed on elbow   Swelling noted

## 2020-02-13 ENCOUNTER — Other Ambulatory Visit: Payer: Self-pay

## 2020-02-13 ENCOUNTER — Encounter: Payer: Self-pay | Admitting: Intensive Care

## 2020-02-13 DIAGNOSIS — E162 Hypoglycemia, unspecified: Secondary | ICD-10-CM | POA: Insufficient documentation

## 2020-02-13 DIAGNOSIS — Z531 Procedure and treatment not carried out because of patient's decision for reasons of belief and group pressure: Secondary | ICD-10-CM | POA: Insufficient documentation

## 2020-02-13 LAB — CBC
HCT: 42.8 % (ref 39.0–52.0)
Hemoglobin: 14.2 g/dL (ref 13.0–17.0)
MCH: 29.2 pg (ref 26.0–34.0)
MCHC: 33.2 g/dL (ref 30.0–36.0)
MCV: 87.9 fL (ref 80.0–100.0)
Platelets: 352 10*3/uL (ref 150–400)
RBC: 4.87 MIL/uL (ref 4.22–5.81)
RDW: 13.3 % (ref 11.5–15.5)
WBC: 7.9 10*3/uL (ref 4.0–10.5)
nRBC: 0 % (ref 0.0–0.2)

## 2020-02-13 LAB — BASIC METABOLIC PANEL
Anion gap: 11 (ref 5–15)
BUN: 15 mg/dL (ref 6–20)
CO2: 30 mmol/L (ref 22–32)
Calcium: 9.9 mg/dL (ref 8.9–10.3)
Chloride: 100 mmol/L (ref 98–111)
Creatinine, Ser: 0.88 mg/dL (ref 0.61–1.24)
GFR, Estimated: >60 mL/min (ref >60)
Glucose, Bld: 213 mg/dL — ABNORMAL HIGH (ref 70–99)
Potassium: 4.5 mmol/L (ref 3.5–5.1)
Sodium: 141 mmol/L (ref 135–145)

## 2020-02-13 LAB — URINALYSIS, COMPLETE (UACMP) WITH MICROSCOPIC
Bacteria, UA: NONE SEEN
Bilirubin Urine: NEGATIVE
Glucose, UA: >=500 mg/dL — AB
Hgb urine dipstick: NEGATIVE
Ketones, ur: NEGATIVE mg/dL
Leukocytes,Ua: NEGATIVE
Nitrite: NEGATIVE
Protein, ur: NEGATIVE mg/dL
Specific Gravity, Urine: 1.013 (ref 1.005–1.030)
Squamous Epithelial / HPF: NONE SEEN (ref 0–5)
pH: 7 (ref 5.0–8.0)

## 2020-02-13 LAB — CBG MONITORING, ED: Glucose-Capillary: 228 mg/dL — ABNORMAL HIGH (ref 70–99)

## 2020-02-13 NOTE — ED Triage Notes (Signed)
Patient came to ER due to his blood sugar meter at home reading 10 and after eating 20. Once patient checked into ER, blood sugar was 228. Only c/o mild headache at this time.

## 2020-02-14 ENCOUNTER — Emergency Department
Admission: EM | Admit: 2020-02-14 | Discharge: 2020-02-14 | Disposition: A | Discharge status: Home / Self Care | Payer: Medicare PPO | Attending: Emergency Medicine | Admitting: Emergency Medicine

## 2020-02-14 HISTORY — DX: Essential (primary) hypertension: I10

## 2020-02-14 NOTE — ED Notes (Signed)
Called x 3 for triage 

## 2020-02-14 NOTE — ED Notes (Signed)
Called x 1 with no answer. 

## 2020-02-14 NOTE — ED Notes (Signed)
Called x 2 with no answer. 

## 2020-08-17 ENCOUNTER — Emergency Department
Admission: EM | Admit: 2020-08-17 | Discharge: 2020-08-17 | Disposition: A | Discharge status: Home / Self Care | Payer: Worker's Compensation | Attending: Emergency Medicine | Admitting: Emergency Medicine

## 2020-08-17 ENCOUNTER — Emergency Department: Payer: Worker's Compensation

## 2020-08-17 ENCOUNTER — Encounter: Payer: Self-pay | Admitting: Emergency Medicine

## 2020-08-17 ENCOUNTER — Other Ambulatory Visit: Payer: Self-pay

## 2020-08-17 DIAGNOSIS — Z7984 Long term (current) use of oral hypoglycemic drugs: Secondary | ICD-10-CM | POA: Diagnosis not present

## 2020-08-17 DIAGNOSIS — Z7982 Long term (current) use of aspirin: Secondary | ICD-10-CM | POA: Diagnosis not present

## 2020-08-17 DIAGNOSIS — S0990XA Unspecified injury of head, initial encounter: Secondary | ICD-10-CM | POA: Diagnosis present

## 2020-08-17 DIAGNOSIS — Y990 Civilian activity done for income or pay: Secondary | ICD-10-CM | POA: Insufficient documentation

## 2020-08-17 DIAGNOSIS — Z79899 Other long term (current) drug therapy: Secondary | ICD-10-CM | POA: Diagnosis not present

## 2020-08-17 DIAGNOSIS — E119 Type 2 diabetes mellitus without complications: Secondary | ICD-10-CM | POA: Insufficient documentation

## 2020-08-17 DIAGNOSIS — S0001XA Abrasion of scalp, initial encounter: Secondary | ICD-10-CM | POA: Insufficient documentation

## 2020-08-17 DIAGNOSIS — Y92002 Bathroom of unspecified non-institutional (private) residence single-family (private) house as the place of occurrence of the external cause: Secondary | ICD-10-CM | POA: Diagnosis not present

## 2020-08-17 DIAGNOSIS — W208XXA Other cause of strike by thrown, projected or falling object, initial encounter: Secondary | ICD-10-CM | POA: Insufficient documentation

## 2020-08-17 DIAGNOSIS — I10 Essential (primary) hypertension: Secondary | ICD-10-CM | POA: Diagnosis not present

## 2020-08-17 NOTE — ED Notes (Signed)
Worker's Comp: Pt's employer requests a UDS.  Consents signed and specimen obtained from pt and released to lab for [facility] pickup via chain of custody protocol.  Specimen ID no: [id number]

## 2020-08-17 NOTE — ED Notes (Signed)
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 Patient denies LOC. Patient denies blood thinner use. Patient is alert, oriented x4. Patient reports hx of brain aneurysm.

## 2020-08-17 NOTE — ED Provider Notes (Signed)
[facility]-EMERGENCY DEPARTMENT  ____________________________________________  Time seen: Approximately 9:28 PM  I have reviewed the triage vital signs and the nursing notes.   HISTORY  Chief Complaint Head Injury   Historian Patient     HPI Charles Bryan is a 55 y.o. male with a history of seizures, migraines, hypertension and diabetes, presents to the emergency department after a piece of metal railing fell on patient while in the bathroom causing to abrasions along the parietal scalp.  Patient did not lose consciousness.  No numbness or tingling in the upper and lower extremities.  No chest pain, chest tightness or abdominal pain.   Past Medical History:  Diagnosis Date   Brain aneurysm 1995   Diabetes mellitus without complication (HCC)    Hypertension    Migraines    Seizures (HCC)      Immunizations up to date:  Yes.     Past Medical History:  Diagnosis Date   Brain aneurysm 1995   Diabetes mellitus without complication (HCC)    Hypertension    Migraines    Seizures (HCC)     There are no problems to display for this patient.   Past Surgical History:  Procedure Laterality Date   BRAIN SURGERY     CHOLECYSTECTOMY     PANCREATIC PSEUDOCYST DRAINAGE      Prior to Admission medications   Medication Sig Start Date End Date Taking? Authorizing Provider  amitriptyline (ELAVIL) 25 MG tablet Take 1 tablet by mouth daily. 12/17/16   [provider]  aspirin EC 81 MG tablet Take 81 mg by mouth daily.    [provider]  gabapentin (NEURONTIN) 800 MG tablet Take 800 mg by mouth 4 (four) times daily.    [provider]  glimepiride (AMARYL) 1 MG tablet Take 1 tablet by mouth daily. 11/15/16   [provider]  ibuprofen (ADVIL,MOTRIN) 600 MG tablet Take 1 tablet (600 mg total) by mouth every 6 (six) hours as needed. 05/22/17   Joni Reining, PA-C  lacosamide (VIMPAT) 50 MG TABS tablet Take 150 mg by mouth 2 (two) times  daily.    [provider]  levETIRAcetam (KEPPRA XR) 500 MG 24 hr tablet Take 500 mg by mouth 2 (two) times daily.    [provider]  lisinopril (PRINIVIL,ZESTRIL) 5 MG tablet Take 1 tablet by mouth daily. 11/15/16   [provider]  metFORMIN (GLUCOPHAGE) 500 MG tablet Take 500 mg by mouth 2 (two) times daily with a meal.     [provider]  Multiple Vitamin (MULTI-VITAMINS) TABS Take 1 tablet by mouth daily.    [provider]  naproxen (NAPROSYN) 500 MG tablet Take 1 tablet (500 mg total) by mouth 2 (two) times daily with a meal. 10/14/17   Joni Reining, PA-C  pioglitazone (ACTOS) 30 MG tablet Take 1 tablet by mouth daily. 11/20/16   [provider]  propranolol (INDERAL) 80 MG tablet Take 80 mg by mouth daily.     [provider]  SUMAtriptan (IMITREX) 100 MG tablet Take 100 mg by mouth every 2 (two) hours as needed for migraine.     [provider]  tiZANidine (ZANAFLEX) 4 MG tablet Take 2-8 mg by mouth See admin instructions. 2 mg every morning and 8 mg at bedtime    [provider]  traMADol (ULTRAM) 50 MG tablet Take 1 tablet (50 mg total) by mouth every 6 (six) hours as needed. 09/05/19   Tommi Rumps,  PA-C    Allergies Patient has no known allergies.  History reviewed. No pertinent family history.  Social History Social History   Tobacco Use   Smoking status: Never   Smokeless tobacco: Never  Vaping Use   Vaping Use: Never used  Substance Use Topics   Alcohol use: No   Drug use: No     Review of Systems  Constitutional: No fever/chills Eyes:  No discharge ENT: No upper respiratory complaints. Respiratory: no cough. No SOB/ use of accessory muscles to breath Gastrointestinal:   No nausea, no vomiting.  No diarrhea.  No constipation. Musculoskeletal: Negative for musculoskeletal pain. Skin: Patient has scalp abrasions.      ____________________________________________   PHYSICAL EXAM:  VITAL SIGNS: ED Triage Vitals  Enc Vitals Group     BP 08/17/20 2037 (!) 162/89     Pulse Rate 08/17/20 2037 72     Resp 08/17/20 2037 16     Temp 08/17/20 2037 98.4 F (36.9 C)     Temp Source 08/17/20 2037 Oral     SpO2 08/17/20 2037 97 %     Weight 08/17/20 2038 195 lb (88.5 kg)     Height 08/17/20 2038 6\' 1"  (1.854 m)     Head Circumference --      Peak Flow --      Pain Score 08/17/20 2038 4     Pain Loc --      Pain Edu? --      Excl. in GC? --      Constitutional: Alert and oriented. Well appearing and in no acute distress. Eyes: Conjunctivae are normal. PERRL. EOMI. Head: Atraumatic. ENT:      Nose: No congestion/rhinnorhea.      Mouth/Throat: Mucous membranes are moist.  Neck: No stridor.  No cervical spine tenderness to palpation. Cardiovascular: Normal rate, regular rhythm. Normal S1 and S2.  Good peripheral circulation. Respiratory: Normal respiratory effort without tachypnea or retractions. Lungs CTAB. Good air entry to the bases with no decreased or absent breath sounds Gastrointestinal: Bowel sounds x 4 quadrants. Soft and nontender to palpation. No guarding or rigidity. No distention. Musculoskeletal: Full range of motion to all extremities. No obvious deformities noted Neurologic:  Normal for age. No gross focal neurologic deficits are appreciated.  Skin: Patient has parietal scalp abrasions.  Psychiatric: Mood and affect are normal for age. Speech and behavior are normal.   ____________________________________________   LABS (all labs ordered are listed, but only abnormal results are displayed)  Labs Reviewed - No data to display ____________________________________________  EKG   ____________________________________________  RADIOLOGY  CT Head Wo Contrast  Result Date: 08/17/2020 CLINICAL DATA:  Recent fall with head injury, initial encounter EXAM: CT HEAD WITHOUT  CONTRAST CT CERVICAL SPINE WITHOUT CONTRAST TECHNIQUE: Multidetector CT imaging of the head and cervical spine was performed following the standard protocol without intravenous contrast. Multiplanar CT image reconstructions of the cervical spine were also generated. COMPARISON:  12/11/2017 FINDINGS: CT HEAD FINDINGS Brain: There again noted changes consistent with prior right MCA infarct with encephalomalacia identified. Postsurgical changes on the right are noted as well. No findings to suggest acute hemorrhage, acute infarction or space-occupying mass lesion are seen. Vascular: No hyperdense vessel or unexpected calcification. Skull: Changes of prior right craniotomy Sinuses/Orbits: No acute finding. Other: None. CT CERVICAL SPINE FINDINGS Alignment: Mild straightening of the normal cervical lordosis is noted consistent with muscular spasm. Skull base and vertebrae: 7 cervical segments are well visualized. Vertebral body height is well  maintained. Mild disc space narrowing is noted from C4-C6. Mild osteophytic changes and facet hypertrophic changes are noted. Soft tissues and spinal canal: Surrounding soft tissue structures are within normal limits. Upper chest: Visualized lung apices are unremarkable. Other: None IMPRESSION: CT of the head: No acute abnormality noted. 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 CT of the cervical spine: Mild degenerative change without acute abnormality. Electronically Signed   By: Alcide Clever M.D.   On: 08/17/2020 22:01   CT Cervical Spine Wo Contrast  Result Date: 08/17/2020 CLINICAL DATA:  Recent fall with head injury, initial encounter EXAM: CT HEAD WITHOUT CONTRAST CT CERVICAL SPINE WITHOUT CONTRAST TECHNIQUE: Multidetector CT imaging of the head and cervical spine was performed following the standard protocol without intravenous contrast. Multiplanar CT image reconstructions of the cervical spine were also generated. COMPARISON:  12/11/2017  FINDINGS: CT HEAD FINDINGS Brain: There again noted changes consistent with prior right MCA infarct with encephalomalacia identified. Postsurgical changes on the right are noted as well. No findings to suggest acute hemorrhage, acute infarction or space-occupying mass lesion are seen. Vascular: No hyperdense vessel or unexpected calcification. Skull: Changes of prior right craniotomy Sinuses/Orbits: No acute finding. Other: None. CT CERVICAL SPINE FINDINGS Alignment: Mild straightening of the normal cervical lordosis is noted consistent with muscular spasm. Skull base and vertebrae: 7 cervical segments are well visualized. Vertebral body height is well maintained. Mild disc space narrowing is noted from C4-C6. Mild osteophytic changes and facet hypertrophic changes are noted. Soft tissues and spinal canal: Surrounding soft tissue structures are within normal limits. Upper chest: Visualized lung apices are unremarkable. Other: None IMPRESSION: CT of the head: No acute abnormality noted. 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 CT of the cervical spine: Mild degenerative change without acute abnormality. Electronically Signed   By: Alcide Clever M.D.   On: 08/17/2020 22:01    ____________________________________________    PROCEDURES  Procedure(s) performed:     Procedures     Medications - No data to display   ____________________________________________   INITIAL IMPRESSION / ASSESSMENT AND PLAN / ED COURSE  Pertinent labs & imaging results that were available during my care of the patient were reviewed by me and considered in my medical decision making (see chart for details).      Assessment and Plan:  Head injury:  55 year old male presents to the emergency department with parietal scalp abrasions.  He had no neurodeficits noted on exam.  CTs of the head and cervical spine showed no evidence of intracranial bleed, skull fracture or C-spine fracture.  Tylenol  was recommended for discomfort.  All patient questions were answered.    ____________________________________________  FINAL CLINICAL IMPRESSION(S) / ED DIAGNOSES  Final diagnoses:  Injury of head, initial encounter      NEW MEDICATIONS STARTED DURING THIS VISIT:  ED Discharge Orders     None           This chart was dictated using voice recognition software/Dragon. Despite best efforts to proofread, errors can occur which can change the meaning. Any change was purely unintentional.     Orvil Feil, PA-C 08/17/20 2231    Chesley Noon, MD 08/17/20 2316

## 2020-08-17 NOTE — ED Triage Notes (Signed)
Pt to ED from work, [employer], for head injury tonight.  States railing in bathroom fell onto head, 2 abrasions noted, no bleeding, denies LOC.

## 2020-09-09 ENCOUNTER — Other Ambulatory Visit: Payer: Self-pay

## 2020-09-09 ENCOUNTER — Encounter: Payer: Self-pay | Admitting: Emergency Medicine

## 2020-09-09 ENCOUNTER — Emergency Department: Payer: Medicare PPO

## 2020-09-09 ENCOUNTER — Emergency Department
Admission: EM | Admit: 2020-09-09 | Discharge: 2020-09-09 | Disposition: A | Discharge status: Home / Self Care | Payer: Medicare PPO | Attending: Emergency Medicine | Admitting: Emergency Medicine

## 2020-09-09 DIAGNOSIS — I10 Essential (primary) hypertension: Secondary | ICD-10-CM | POA: Diagnosis not present

## 2020-09-09 DIAGNOSIS — M542 Cervicalgia: Secondary | ICD-10-CM | POA: Insufficient documentation

## 2020-09-09 DIAGNOSIS — S0001XA Abrasion of scalp, initial encounter: Secondary | ICD-10-CM

## 2020-09-09 DIAGNOSIS — W01198A Fall on same level from slipping, tripping and stumbling with subsequent striking against other object, initial encounter: Secondary | ICD-10-CM | POA: Insufficient documentation

## 2020-09-09 DIAGNOSIS — Z7982 Long term (current) use of aspirin: Secondary | ICD-10-CM | POA: Diagnosis not present

## 2020-09-09 DIAGNOSIS — S0003XA Contusion of scalp, initial encounter: Secondary | ICD-10-CM

## 2020-09-09 DIAGNOSIS — E119 Type 2 diabetes mellitus without complications: Secondary | ICD-10-CM | POA: Diagnosis not present

## 2020-09-09 DIAGNOSIS — S0990XA Unspecified injury of head, initial encounter: Secondary | ICD-10-CM | POA: Diagnosis present

## 2020-09-09 DIAGNOSIS — Z7984 Long term (current) use of oral hypoglycemic drugs: Secondary | ICD-10-CM | POA: Diagnosis not present

## 2020-09-09 DIAGNOSIS — S0083XA Contusion of other part of head, initial encounter: Secondary | ICD-10-CM | POA: Diagnosis not present

## 2020-09-09 DIAGNOSIS — Y92009 Unspecified place in unspecified non-institutional (private) residence as the place of occurrence of the external cause: Secondary | ICD-10-CM | POA: Insufficient documentation

## 2020-09-09 DIAGNOSIS — Z79899 Other long term (current) drug therapy: Secondary | ICD-10-CM | POA: Diagnosis not present

## 2020-09-09 LAB — CBG MONITORING, ED: Glucose-Capillary: 214 mg/dL — ABNORMAL HIGH (ref 70–99)

## 2020-09-09 MED ORDER — NEOSPORIN PLUS PAIN RELIEF MS 3.5-10000-10 EX CREA: TOPICAL_CREAM | Freq: Two times a day (BID) | CUTANEOUS | 0 refills | Status: AC

## 2020-09-09 MED ORDER — ACETAMINOPHEN 325 MG PO TABS
650.0000 mg | ORAL_TABLET | Freq: Once | ORAL | Status: AC
  Administered 2020-09-09: 650 mg via ORAL
  Filled 2020-09-09: qty 2

## 2020-09-09 MED ORDER — NEOSPORIN PLUS PAIN RELIEF MS 3.5-10000-10 EX CREA: TOPICAL_CREAM | Freq: Two times a day (BID) | CUTANEOUS | 0 refills | Status: DC

## 2020-09-09 MED ORDER — BACITRACIN-NEOMYCIN-POLYMYXIN OINTMENT TUBE
TOPICAL_OINTMENT | Freq: Two times a day (BID) | CUTANEOUS | Status: DC
  Filled 2020-09-09: qty 14.17

## 2020-09-09 MED ORDER — BACITRACIN-NEOMYCIN-POLYMYXIN 400-5-5000 EX OINT
TOPICAL_OINTMENT | Freq: Once | CUTANEOUS | Status: AC
  Filled 2020-09-09: qty 1

## 2020-09-09 NOTE — ED Triage Notes (Signed)
Presents via EMS  states she was coming out of house  lost his balance a fell  states he does have weakness to left leg which is normal

## 2020-09-09 NOTE — ED Provider Notes (Signed)
[facility] Emergency Department Provider Note   ____________________________________________   Event Date/Time   First MD Initiated Contact with Patient 09/09/20 1308     (approximate)  I have reviewed the triage vital signs and the nursing notes.   HISTORY  Chief Complaint Fall    HPI Charles Bryan is a 55 y.o. male patient arrived via EMS secondary to fall hitting the back of his head.  Also complain of posterior neck pain.  Patient has a history of left hemiparesis resulting in weakness to the left side.  Patient said he lost his balance and fell.  Patient states he did not believe he lost consciousness but was dazed.  Denies vision disturbance.  Denies loss of back, chest, or abdominal pain.  Denies upper or lower extremity pain.  Denies radicular component to his neck pain.     Past Medical History:  Diagnosis Date   Brain aneurysm 1995   Diabetes mellitus without complication (HCC)    Hypertension    Migraines    Seizures (HCC)     There are no problems to display for this patient.   Past Surgical History:  Procedure Laterality Date   BRAIN SURGERY     CHOLECYSTECTOMY     PANCREATIC PSEUDOCYST DRAINAGE      Prior to Admission medications   Medication Sig Start Date End Date Taking? Authorizing Provider  neomycin-polymyxin-pramoxine (NEOSPORIN PLUS) 1 % cream Apply topically 2 (two) times daily. 09/09/20  Yes Joni Reining, PA-C  amitriptyline (ELAVIL) 25 MG tablet Take 1 tablet by mouth daily. 12/17/16   [provider]  aspirin EC 81 MG tablet Take 81 mg by mouth daily.    [provider]  gabapentin (NEURONTIN) 800 MG tablet Take 800 mg by mouth 4 (four) times daily.    [provider]  glimepiride (AMARYL) 1 MG tablet Take 1 tablet by mouth daily. 11/15/16   [provider]  ibuprofen (ADVIL,MOTRIN) 600 MG tablet Take 1 tablet (600 mg total) by mouth every 6 (six) hours as needed. 05/22/17    Joni Reining, PA-C  lacosamide (VIMPAT) 50 MG TABS tablet Take 150 mg by mouth 2 (two) times daily.    [provider]  levETIRAcetam (KEPPRA XR) 500 MG 24 hr tablet Take 500 mg by mouth 2 (two) times daily.    [provider]  lisinopril (PRINIVIL,ZESTRIL) 5 MG tablet Take 1 tablet by mouth daily. 11/15/16   [provider]  metFORMIN (GLUCOPHAGE) 500 MG tablet Take 500 mg by mouth 2 (two) times daily with a meal.     [provider]  Multiple Vitamin (MULTI-VITAMINS) TABS Take 1 tablet by mouth daily.    [provider]  naproxen (NAPROSYN) 500 MG tablet Take 1 tablet (500 mg total) by mouth 2 (two) times daily with a meal. 10/14/17   Joni Reining, PA-C  pioglitazone (ACTOS) 30 MG tablet Take 1 tablet by mouth daily. 11/20/16   [provider]  propranolol (INDERAL) 80 MG tablet Take 80 mg by mouth daily.     [provider]  SUMAtriptan (IMITREX) 100 MG tablet Take 100 mg by mouth every 2 (two) hours as needed for migraine.     [provider]  tiZANidine (ZANAFLEX) 4 MG tablet Take 2-8 mg by mouth See admin instructions. 2 mg every morning and 8 mg at bedtime    [provider]  traMADol (ULTRAM) 50 MG tablet Take 1 tablet (50  mg total) by mouth every 6 (six) hours as needed. 09/05/19   Tommi Rumps, PA-C    Allergies Patient has no known allergies.  No family history on file.  Social History Social History   Tobacco Use   Smoking status: Never   Smokeless tobacco: Never  Vaping Use   Vaping Use: Never used  Substance Use Topics   Alcohol use: No   Drug use: No    Review of Systems Constitutional: No fever/chills Eyes: No visual changes. ENT: No sore throat. Cardiovascular: Denies chest pain. Respiratory: Denies shortness of breath. Gastrointestinal: No abdominal pain.  No nausea, no vomiting.  No diarrhea.  No constipation. Genitourinary: Negative for dysuria. Musculoskeletal:  Negative for back pain. Skin: Negative for rash.  Abrasion posterior scalp. Neurological: Negative for headaches, focal weakness or numbness.  ____________________________________________   PHYSICAL EXAM:  VITAL SIGNS: ED Triage Vitals [09/09/20 1256]  Enc Vitals Group     BP 140/80     Pulse Rate 60     Resp 18     Temp 97.9 F (36.6 C)     Temp Source Oral     SpO2 98 %     Weight 195 lb (88.5 kg)     Height 6\' 1"  (1.854 m)     Head Circumference      Peak Flow      Pain Score 3     Pain Loc      Pain Edu?      Excl. in GC?    Constitutional: Alert and oriented. Well appearing and in no acute distress. Eyes: Conjunctivae are normal. PERRL. EOMI. Head: Atraumatic.  Hematoma posterior scalp Nose: No congestion/rhinnorhea. Mouth/Throat: Mucous membranes are moist.  Oropharynx non-erythematous. Neck: No stridor.  No cervical spine tenderness to palpation. Hematological/Lymphatic/Immunilogical: No cervical lymphadenopathy. Cardiovascular: Normal rate, regular rhythm. Grossly normal heart sounds.  Good peripheral circulation. Respiratory: Normal respiratory effort.  No retractions. Lungs CTAB. Gastrointestinal: Soft and nontender. No distention. No abdominal bruits. No CVA tenderness. Genitourinary: Deferred Musculoskeletal: No lower extremity tenderness nor edema.  No joint effusions. Neurologic:  Normal speech and language.  Left hemiparesis resulting in gait instability. Skin:  Skin is warm, dry and intact. No rash noted.  Abrasion posterior scalp Psychiatric: Mood and affect are normal. Speech and behavior are normal.  ____________________________________________   LABS (all labs ordered are listed, but only abnormal results are displayed)  Labs Reviewed  CBG MONITORING, ED - Abnormal; Notable for the following components:      Result Value   Glucose-Capillary 214 (*)    All other components within normal limits    ____________________________________________  EKG   ____________________________________________  RADIOLOGY I, , personally viewed and evaluated these images (plain radiographs) as part of my medical decision making, as well as reviewing the written report by the radiologist.  ED MD interpretation:    Official radiology report(s): CT HEAD WO CONTRAST (Joni Reining)  Result Date: 09/09/2020 CLINICAL DATA:  Poly trauma EXAM: CT HEAD WITHOUT CONTRAST CT CERVICAL SPINE WITHOUT CONTRAST TECHNIQUE: Multidetector CT imaging of the head and cervical spine was performed following the standard protocol without intravenous contrast. Multiplanar CT image reconstructions of the cervical spine were also generated. COMPARISON:  CT cervical spine and head dated August 17, 2020; FINDINGS: CT HEAD FINDINGS Brain: Encephalomalacia of the right cerebrum, compatible with prior MCA infarct. No evidence of acute infarction, hemorrhage, hydrocephalus, extra-axial collection or mass lesion/mass effect. Vascular: No hyperdense vessel or unexpected calcification. Skull:  Normal. Negative for fracture or focal lesion. Sinuses/Orbits: No acute finding. Other: Soft tissue swelling of the posterior scalp. CT CERVICAL SPINE FINDINGS Alignment: Normal. Skull base and vertebrae: No acute fracture. No primary bone lesion or focal pathologic process. Soft tissues and spinal canal: No prevertebral fluid or swelling. No visible canal hematoma. Disc levels:  Moderate degenerative disc disease at C4-C5. Upper chest: Negative. Other: None IMPRESSION: Soft tissue swelling of the posterior scalp. No acute intracranial process. No traumatic CT findings seen in the cervical spine. Electronically Signed   By: Allegra Lai MD   On: 09/09/2020 14:14   CT Cervical Spine Wo Contrast  Result Date: 09/09/2020 CLINICAL DATA:  Poly trauma EXAM: CT HEAD WITHOUT CONTRAST CT CERVICAL SPINE WITHOUT CONTRAST TECHNIQUE: Multidetector CT imaging of  the head and cervical spine was performed following the standard protocol without intravenous contrast. Multiplanar CT image reconstructions of the cervical spine were also generated. COMPARISON:  CT cervical spine and head dated August 17, 2020; FINDINGS: CT HEAD FINDINGS Brain: Encephalomalacia of the right cerebrum, compatible with prior MCA infarct. No evidence of acute infarction, hemorrhage, hydrocephalus, extra-axial collection or mass lesion/mass effect. Vascular: No hyperdense vessel or unexpected calcification. Skull: Normal. Negative for fracture or focal lesion. Sinuses/Orbits: No acute finding. Other: Soft tissue swelling of the posterior scalp. CT CERVICAL SPINE FINDINGS Alignment: Normal. Skull base and vertebrae: No acute fracture. No primary bone lesion or focal pathologic process. Soft tissues and spinal canal: No prevertebral fluid or swelling. No visible canal hematoma. Disc levels:  Moderate degenerative disc disease at C4-C5. Upper chest: Negative. Other: None IMPRESSION: Soft tissue swelling of the posterior scalp. No acute intracranial process. No traumatic CT findings seen in the cervical spine. Electronically Signed   By: Allegra Lai MD   On: 09/09/2020 14:14    ____________________________________________   PROCEDURES  Procedure(s) performed (including Critical Care):  Procedures   ____________________________________________   INITIAL IMPRESSION / ASSESSMENT AND PLAN / ED COURSE  As part of my medical decision making, I reviewed the following data within the electronic MEDICAL RECORD NUMBER         Patient presents with small hematoma and abrasion to the posterior scalp secondary to a fall prior to arrival.  Patient denies LOC.  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  Discussed CT findings unremarkable save for small hematoma.      ____________________________________________   FINAL CLINICAL  IMPRESSION(S) / ED DIAGNOSES  Final diagnoses:  Contusion of scalp, initial encounter  Abrasion of scalp, initial encounter     ED Discharge Orders          Ordered    neomycin-polymyxin-pramoxine (NEOSPORIN PLUS) 1 % cream  2 times daily        09/09/20 1434             Note:  This document was prepared using Dragon voice recognition software and may include unintentional dictation errors.    Joni Reining, PA-C 09/09/20 1435    Jene Every, MD 09/09/20 1440

## 2020-09-09 NOTE — ED Notes (Signed)
Patient states he felt dizzy when he sat up. Patient remains alert and oriented x4. Patient appears pale. Patient was assisted to lie down on stretcher. Patient has called for ride. Arlyss Queen PA-C aware.

## 2020-09-09 NOTE — ED Notes (Signed)
Patient stated he felt better and sister was on her way to pick him up. Patient was transferred to wheelchair with one assist.

## 2020-09-09 NOTE — Discharge Instructions (Addendum)
Read and follow discharge care instructions.  Advise over-the-counter Tylenol, 650 mg every 4 to 6 hours as needed for headache/pain.  Applied Neosporin to scalp abrasion.

## 2020-10-04 ENCOUNTER — Other Ambulatory Visit: Payer: Self-pay

## 2020-10-04 ENCOUNTER — Emergency Department
Admission: EM | Admit: 2020-10-04 | Discharge: 2020-10-04 | Disposition: A | Discharge status: Home / Self Care | Payer: Medicare PPO | Attending: Student in an Organized Health Care Education/Training Program | Admitting: Student in an Organized Health Care Education/Training Program

## 2020-10-04 ENCOUNTER — Emergency Department: Payer: Medicare PPO

## 2020-10-04 DIAGNOSIS — I10 Essential (primary) hypertension: Secondary | ICD-10-CM | POA: Diagnosis not present

## 2020-10-04 DIAGNOSIS — Z79899 Other long term (current) drug therapy: Secondary | ICD-10-CM | POA: Diagnosis not present

## 2020-10-04 DIAGNOSIS — E119 Type 2 diabetes mellitus without complications: Secondary | ICD-10-CM | POA: Diagnosis not present

## 2020-10-04 DIAGNOSIS — Z7982 Long term (current) use of aspirin: Secondary | ICD-10-CM | POA: Diagnosis not present

## 2020-10-04 DIAGNOSIS — S42022A Displaced fracture of shaft of left clavicle, initial encounter for closed fracture: Secondary | ICD-10-CM | POA: Diagnosis not present

## 2020-10-04 DIAGNOSIS — Z7984 Long term (current) use of oral hypoglycemic drugs: Secondary | ICD-10-CM | POA: Insufficient documentation

## 2020-10-04 DIAGNOSIS — W010XXA Fall on same level from slipping, tripping and stumbling without subsequent striking against object, initial encounter: Secondary | ICD-10-CM | POA: Insufficient documentation

## 2020-10-04 DIAGNOSIS — S4991XA Unspecified injury of right shoulder and upper arm, initial encounter: Secondary | ICD-10-CM | POA: Diagnosis present

## 2020-10-04 MED ORDER — MELOXICAM 15 MG PO TABS: 15.0000 mg | ORAL_TABLET | Freq: Every day | ORAL | 2 refills | Status: AC

## 2020-10-04 MED ORDER — HYDROCODONE-ACETAMINOPHEN 5-325 MG PO TABS: 1.0000 | ORAL_TABLET | Freq: Four times a day (QID) | ORAL | 0 refills | Status: AC | PRN

## 2020-10-04 NOTE — ED Triage Notes (Signed)
Pt in with co fall around 1800 states tripped over a sign. Pt fell and is now having left shoulder and left clavicular pain. No head or neck injury voiced.

## 2020-10-04 NOTE — ED Provider Notes (Signed)
[facility] Emergency Department Provider Note  ____________________________________________   Event Date/Time   First MD Initiated Contact with Patient 10/04/20 [phone]     (approximate)  I have reviewed the triage vital signs and the nursing notes.   HISTORY  Chief Complaint Shoulder Pain    HPI Charles Bryan is a 55 y.o. male presents emergency department complaint of left shoulder pain after he fell on a sidewalk.  States he tripped over a sign.  No head injury, no LOC.  The events happened at 6 PM last night  Past Medical History:  Diagnosis Date   Brain aneurysm 1995   Diabetes mellitus without complication (HCC)    Hypertension    Migraines    Seizures (HCC)     There are no problems to display for this patient.   Past Surgical History:  Procedure Laterality Date   BRAIN SURGERY     CHOLECYSTECTOMY     PANCREATIC PSEUDOCYST DRAINAGE      Prior to Admission medications   Medication Sig Start Date End Date Taking? Authorizing Provider  amitriptyline (ELAVIL) 25 MG tablet Take 1 tablet by mouth daily. 12/17/16   [provider]  aspirin EC 81 MG tablet Take 81 mg by mouth daily.    [provider]  gabapentin (NEURONTIN) 800 MG tablet Take 800 mg by mouth 4 (four) times daily.    [provider]  glimepiride (AMARYL) 1 MG tablet Take 1 tablet by mouth daily. 11/15/16   [provider]  ibuprofen (ADVIL,MOTRIN) 600 MG tablet Take 1 tablet (600 mg total) by mouth every 6 (six) hours as needed. 05/22/17   Joni Reining, PA-C  lacosamide (VIMPAT) 50 MG TABS tablet Take 150 mg by mouth 2 (two) times daily.    [provider]  levETIRAcetam (KEPPRA XR) 500 MG 24 hr tablet Take 500 mg by mouth 2 (two) times daily.    [provider]  lisinopril (PRINIVIL,ZESTRIL) 5 MG tablet Take 1 tablet by mouth daily. 11/15/16   [provider]  metFORMIN (GLUCOPHAGE) 500 MG tablet Take 500 mg  by mouth 2 (two) times daily with a meal.     [provider]  Multiple Vitamin (MULTI-VITAMINS) TABS Take 1 tablet by mouth daily.    [provider]  naproxen (NAPROSYN) 500 MG tablet Take 1 tablet (500 mg total) by mouth 2 (two) times daily with a meal. 10/14/17   Joni Reining, PA-C  neomycin-polymyxin-pramoxine (NEOSPORIN PLUS) 1 % cream Apply topically 2 (two) times daily. 09/09/20   Menshew, Charlesetta Ivory, PA-C  pioglitazone (ACTOS) 30 MG tablet Take 1 tablet by mouth daily. 11/20/16   [provider]  propranolol (INDERAL) 80 MG tablet Take 80 mg by mouth daily.     [provider]  SUMAtriptan (IMITREX) 100 MG tablet Take 100 mg by mouth every 2 (two) hours as needed for migraine.     [provider]  tiZANidine (ZANAFLEX) 4 MG tablet Take 2-8 mg by mouth See admin instructions. 2 mg every morning and 8 mg at bedtime    [provider]  traMADol (ULTRAM) 50 MG tablet Take 1 tablet (50 mg total) by mouth every 6 (six) hours as needed. 09/05/19   Tommi Rumps, PA-C    Allergies Patient has no known allergies.  No family history on file.  Social History Social History   Tobacco Use   Smoking status: Never   Smokeless tobacco: Never  Vaping Use   Vaping Use: Never used  Substance Use Topics   Alcohol use: No   Drug use: No    Review of Systems  Constitutional: No fever/chills Eyes: No visual changes. ENT: No sore throat. Respiratory: Denies cough Cardiovascular: Denies chest pain Gastrointestinal: Denies abdominal pain Genitourinary: Negative for dysuria. Musculoskeletal: Negative for back pain. Skin: Negative for rash. Psychiatric: no mood changes,     ____________________________________________   PHYSICAL EXAM:  VITAL SIGNS: ED Triage Vitals  Enc Vitals Group     BP 10/04/20 0052 (!) 168/93     Pulse Rate 10/04/20 0052 78     Resp 10/04/20 0052 16     Temp 10/04/20 0052 98.6 F (37 C)     Temp  Source 10/04/20 0052 Oral     SpO2 10/04/20 0052 96 %     Weight 10/04/20 0053 195 lb (88.5 kg)     Height 10/04/20 0053 6\' 1"  (1.854 m)     Head Circumference --      Peak Flow --      Pain Score 10/04/20 0052 10     Pain Loc --      Pain Edu? --      Excl. in GC? --     Constitutional: Alert and oriented. Well appearing and in no acute distress. Eyes: Conjunctivae are normal.  Head: Atraumatic. Nose: No congestion/rhinnorhea. Mouth/Throat: Mucous membranes are moist.   Neck:  supple no lymphadenopathy noted Cardiovascular: Normal rate, regular rhythm.  Respiratory: Normal respiratory effort.  No retractions,  GU: deferred Musculoskeletal: Decreased range of motion of the left arm, swelling noted at the left clavicle, humerus is nontender, forearm is nontender, neurovascular is intact, C-spine is nontender neurologic:  Normal speech and language.  Cranial nerves II through XII grossly intact Skin:  Skin is warm, dry and intact. No rash noted. Psychiatric: Mood and affect are normal. Speech and behavior are normal.  ____________________________________________   LABS (all labs ordered are listed, but only abnormal results are displayed)  Labs Reviewed - No data to display ____________________________________________   ____________________________________________  RADIOLOGY  X-ray left shoulder and clavicle ordered from triage  ____________________________________________   PROCEDURES  Procedure(s) performed: Sling applied by nursing staff  Procedures    ____________________________________________   INITIAL IMPRESSION / ASSESSMENT AND PLAN / ED COURSE  Pertinent labs & imaging results that were available during my care of the patient were reviewed by me and considered in my medical decision making (see chart for details).   Patient is a 55 year old male presents with left shoulder injury see HPI.  Physical exam shows that exam be consistent with a clavicle  fracture.  X-ray of the left shoulder and left clavicle reviewed by me confirmed by radiology to have a clavicular fracture  Did explain findings to the patient.  He was given a new sling as he does not want his old one.  He is to apply ice.  Work restrictions to include no use of the left arm until released by orthopedics.  Patient states he wants to go to [facility] as this is where his doctors are.  He was discharged in stable condition with prescription for pain medication.     Charles Bryan was evaluated in Emergency Department on 10/04/2020 for the symptoms described in the history of present illness. He was evaluated in the context of the global COVID-19 pandemic, which necessitated consideration that the patient might be at risk for infection with the SARS-CoV-2  virus that causes COVID-19. Institutional protocols and algorithms that pertain to the evaluation of patients at risk for COVID-19 are in a state of rapid change based on information released by regulatory bodies including the CDC and federal and state organizations. These policies and algorithms were followed during the patient's care in the ED.    As part of my medical decision making, I reviewed the following data within the electronic MEDICAL RECORD NUMBER Nursing notes reviewed and incorporated, Old chart reviewed, Radiograph reviewed , Notes from prior ED visits, and [place] Controlled Substance Database  ____________________________________________   FINAL CLINICAL IMPRESSION(S) / ED DIAGNOSES  Final diagnoses:  Displaced fracture of shaft of left clavicle, initial encounter for closed fracture      NEW MEDICATIONS STARTED DURING THIS VISIT:  New Prescriptions   No medications on file     Note:  This document was prepared using Dragon voice recognition software and may include unintentional dictation errors.    Faythe Ghee, PA-C 10/04/20 3734    Willy Eddy, MD 10/04/20 [phone]

## 2020-10-04 NOTE — Discharge Instructions (Signed)
Follow up with orthopedics, keep ice on the left clavicle, wear the sling until evaluated by orthopedics, take medication as needed

## 2020-10-04 NOTE — ED Notes (Signed)
See triage note  Presents s/p fall  States he stepped back and fell over a sign on the sidewalk  having pain to left shoulder  Denies hitting his head

## 2021-02-05 ENCOUNTER — Encounter: Payer: Self-pay | Admitting: Intensive Care

## 2021-02-05 ENCOUNTER — Other Ambulatory Visit: Payer: Self-pay

## 2021-02-05 ENCOUNTER — Emergency Department: Payer: Medicare PPO

## 2021-02-05 DIAGNOSIS — E119 Type 2 diabetes mellitus without complications: Secondary | ICD-10-CM | POA: Insufficient documentation

## 2021-02-05 DIAGNOSIS — W01190A Fall on same level from slipping, tripping and stumbling with subsequent striking against furniture, initial encounter: Secondary | ICD-10-CM | POA: Insufficient documentation

## 2021-02-05 DIAGNOSIS — R55 Syncope and collapse: Secondary | ICD-10-CM | POA: Insufficient documentation

## 2021-02-05 DIAGNOSIS — S0990XA Unspecified injury of head, initial encounter: Secondary | ICD-10-CM | POA: Insufficient documentation

## 2021-02-05 DIAGNOSIS — I10 Essential (primary) hypertension: Secondary | ICD-10-CM | POA: Insufficient documentation

## 2021-02-05 LAB — CBC
HCT: 41.2 % (ref 39.0–52.0)
Hemoglobin: 13.6 g/dL (ref 13.0–17.0)
MCH: 28.4 pg (ref 26.0–34.0)
MCHC: 33 g/dL (ref 30.0–36.0)
MCV: 86 fL (ref 80.0–100.0)
Platelets: 267 10*3/uL (ref 150–400)
RBC: 4.79 MIL/uL (ref 4.22–5.81)
RDW: 13.8 % (ref 11.5–15.5)
WBC: 7.3 10*3/uL (ref 4.0–10.5)
nRBC: 0 % (ref 0.0–0.2)

## 2021-02-05 LAB — BASIC METABOLIC PANEL
Anion gap: 7 (ref 5–15)
BUN: 18 mg/dL (ref 6–20)
CO2: 26 mmol/L (ref 22–32)
Calcium: 9.8 mg/dL (ref 8.9–10.3)
Chloride: 105 mmol/L (ref 98–111)
Creatinine, Ser: 0.78 mg/dL (ref 0.61–1.24)
GFR, Estimated: >60 mL/min (ref >60)
Glucose, Bld: 210 mg/dL — ABNORMAL HIGH (ref 70–99)
Potassium: 3.7 mmol/L (ref 3.5–5.1)
Sodium: 138 mmol/L (ref 135–145)

## 2021-02-05 LAB — TROPONIN I (HIGH SENSITIVITY): Troponin I (High Sensitivity): 5 ng/L (ref <18)

## 2021-02-05 NOTE — ED Triage Notes (Signed)
Patient reports falling and hitting the back of his head on the corner of a table in his home. Patient is unsure of how he fell. Denies LOC once hitting head. Denies blood thinners

## 2021-02-05 NOTE — ED Provider Notes (Signed)
°  Emergency Medicine Provider Triage Evaluation Note  Charles Bryan , a 56 y.o.male,  was evaluated in triage.  Pt complains of head injury.  Patient states that he slipped on a wet floor and fell backwards, hitting his head on a table.  Denies LOC, nausea/vomiting, blood thinner use, visual disturbances, or shortness of breath.   Review of Systems  Positive: Head injury Negative: Denies fever, chest pain, vomiting  Physical Exam  There were no vitals filed for this visit. Gen:   Awake, no distress   Resp:  Normal effort  MSK:   Moves extremities without difficulty  Other:  Hematoma on the posterior aspect of the skull.  Medical Decision Making  Given the patient's initial medical screening exam, the following diagnostic evaluation has been ordered. The patient will be placed in the appropriate treatment space, once one is available, to complete the evaluation and treatment. I have discussed the plan of care with the patient and I have advised the patient that an ED physician or mid-level practitioner will reevaluate their condition after the test results have been received, as the results may give them additional insight into the type of treatment they may need.    Diagnostics: Head CT, cervical spine CT.  Treatments: none immediately   Varney Daily, PA 02/05/21 1847    Gilles Chiquito, MD 02/05/21 Ebony Cargo

## 2021-02-05 NOTE — ED Notes (Signed)
Attempted to redraw Troponin and was unsuccessful

## 2021-02-06 ENCOUNTER — Emergency Department
Admission: EM | Admit: 2021-02-06 | Discharge: 2021-02-06 | Disposition: A | Discharge status: Home / Self Care | Payer: Medicare PPO | Attending: Emergency Medicine | Admitting: Emergency Medicine

## 2021-02-06 DIAGNOSIS — R55 Syncope and collapse: Secondary | ICD-10-CM

## 2021-02-06 LAB — TROPONIN I (HIGH SENSITIVITY): Troponin I (High Sensitivity): 5 ng/L (ref <18)

## 2021-02-06 NOTE — ED Provider Notes (Signed)
[facility] Provider Note    Event Date/Time   First MD Initiated Contact with Patient 02/06/21 0150     (approximate)   History   Fall   HPI  Charles Bryan is a 56 y.o. male past medical history of diabetes, hypertension, daughter who presents after a syncopal episode.  Patient tells me that he was getting up from the couch and walked about several feet and then on the ground.  He not have any preceding lightheadedness or dizziness.  Denies preceding chest pain palpitations or shortness of breath.  Patient notes that usually when he gets up from sitting he will have some initial unsteadiness and it will take him some time to equilibrate however and he felt this way today when he stood up.  Has had a syncopal episode in the past which he was told was due to vertigo.  He does have some swelling in the back of his head and pain in that location.  Otherwise denies nausea vomiting numbness weakness or pain elsewhere.  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    Past Medical History:  Diagnosis Date   Brain aneurysm 1995   Diabetes mellitus without complication (HCC)    Hypertension    Migraines    Seizures (HCC)     There are no problems to display for this patient.    Physical Exam  Triage Vital Signs: ED Triage Vitals  Enc Vitals Group     BP 02/05/21 1855 (!) 169/81     Pulse Rate 02/05/21 1855 (!) 58     Resp 02/05/21 1855 16     Temp 02/05/21 1855 98.4 F (36.9 C)     Temp Source 02/05/21 1855 Oral     SpO2 02/05/21 1855 97 %     Weight 02/05/21 1858 200 lb (90.7 kg)     Height 02/05/21 1858 6\' 1"  (1.854 m)     Head Circumference --      Peak Flow --      Pain Score 02/05/21 1857 5     Pain Loc --      Pain Edu? --      Excl. in GC? --     Most recent vital signs: Vitals:   02/05/21 2342 02/06/21 0302  BP: (!) 183/78 (!) 183/89  Pulse: (!) 54 (!) 51  Resp: 18 16  Temp:  98.5 F (36.9 C)   SpO2: 97% 99%     General: Awake, no distress.  CV:  Good peripheral perfusion.  Resp:  Normal effort.  Abd:  No distention.  Neuro:             Awake, Alert, Oriented x 3    Left-sided facial droop, decreased strength in the left arm and leg which is chronic Other:  Significant swelling in the occipital region, no overlying laceration   ED Results / Procedures / Treatments  Labs (all labs ordered are listed, but only abnormal results are displayed) Labs Reviewed  BASIC METABOLIC PANEL - Abnormal; Notable for the following components:      Result Value   Glucose, Bld 210 (*)    All other components within normal limits  CBC  TROPONIN I (HIGH SENSITIVITY)  TROPONIN I (HIGH SENSITIVITY)     EKG  Reviewed by myself, sinus bradycardia, no acute ischemic changes, axis normal intervals   RADIOLOGY I reviewed the CT scan of the brain which does not  show any acute intracranial process; agree with radiology report   I reviewed the CT of the cervical spine which does not show any acute fracture or misalignment; agree with radiology report     PROCEDURES:  Critical Care performed: No  Procedures  The patient is on the cardiac monitor to evaluate for evidence of arrhythmia and/or significant heart rate changes.   MEDICATIONS ORDERED IN ED: Medications - No data to display   IMPRESSION / MDM / ASSESSMENT AND PLAN / ED COURSE  I reviewed the triage vital signs and the nursing notes.                              Differential diagnosis includes, but is not limited to, syncope, cardiac arrhythmia, orthostatic, vasovagal  56 year old male presenting after a syncopal episode and closed head injury.  Occurred after he was from sitting to standing although he had no prodromal lightheadedness or dizziness.  Also no concerning prodrome of chest pain or dyspnea or palpitations.  Woke up on the floor without any postictal..  On exam he has obvious swelling in  the posterior occipital region.  He has residual left-sided weakness and a left-sided facial droop which is not new for him.  His CT head does not have any acute findings.  CT C-spine also no acute findings.  His EKG is sinus bradycardia without concerning features.  Patient notes that he is typically bradycardic with a heart rate in the 50s to 60s.  He is asymptomatic currently.  His syncopal episode without prodrome is potentially concerning for cardiac arrhythmia however with his otherwise reassuring work-up no ongoing symptoms here I feel that he can be seen as an outpatient for possible longer cardiac monitoring.    FINAL CLINICAL IMPRESSION(S) / ED DIAGNOSES   Final diagnoses:  Syncope, unspecified syncope type     Rx / DC Orders   ED Discharge Orders     None        Note:  This document was prepared using Dragon voice recognition software and may include unintentional dictation errors.   Georga Hacking, MD 02/06/21 [phone]

## 2021-02-06 NOTE — Discharge Instructions (Signed)
Your blood work was normal and your CAT scans did not show any acute injury.  You likely did have a syncopal episode and you should follow-up with a cardiologist as you may need to have a monitor to make sure you are not having any cardiac arrhythmias that could cause you to pass out.

## 2021-03-25 ENCOUNTER — Emergency Department
Admission: EM | Admit: 2021-03-25 | Discharge: 2021-03-25 | Disposition: A | Discharge status: Home / Self Care | Payer: Worker's Compensation | Attending: Emergency Medicine | Admitting: Emergency Medicine

## 2021-03-25 ENCOUNTER — Emergency Department: Payer: Worker's Compensation

## 2021-03-25 ENCOUNTER — Other Ambulatory Visit: Payer: Self-pay

## 2021-03-25 DIAGNOSIS — Y990 Civilian activity done for income or pay: Secondary | ICD-10-CM | POA: Insufficient documentation

## 2021-03-25 DIAGNOSIS — S42202A Unspecified fracture of upper end of left humerus, initial encounter for closed fracture: Secondary | ICD-10-CM | POA: Diagnosis not present

## 2021-03-25 DIAGNOSIS — W010XXA Fall on same level from slipping, tripping and stumbling without subsequent striking against object, initial encounter: Secondary | ICD-10-CM | POA: Diagnosis not present

## 2021-03-25 DIAGNOSIS — S4992XA Unspecified injury of left shoulder and upper arm, initial encounter: Secondary | ICD-10-CM | POA: Diagnosis present

## 2021-03-25 DIAGNOSIS — S42292A Other displaced fracture of upper end of left humerus, initial encounter for closed fracture: Secondary | ICD-10-CM

## 2021-03-25 MED ORDER — OXYCODONE-ACETAMINOPHEN 5-325 MG PO TABS: 1.0000 | ORAL_TABLET | Freq: Four times a day (QID) | ORAL | 0 refills | Status: AC | PRN

## 2021-03-25 MED ORDER — OXYCODONE-ACETAMINOPHEN 5-325 MG PO TABS
1.0000 | ORAL_TABLET | Freq: Once | ORAL | Status: AC
  Administered 2021-03-25: 1 via ORAL
  Filled 2021-03-25: qty 1

## 2021-03-25 NOTE — ED Triage Notes (Signed)
Pt arrived POV. PT was at work and lost his balance and fell onto a stack of dog food causing it to shift and pt sliding to the floor landing on left shoulder. Pt has pain in left shoulder and has sensation in left arm. Pt normally has no feeling in left arm due to a old brain injury.

## 2021-03-25 NOTE — ED Provider Notes (Signed)
[facility] Provider Note  Patient Contact: 10:06 PM (approximate)   History   Shoulder Injury (Left/)   HPI  Charles Bryan is a 56 y.o. male who presents the emergency department with left shoulder pain.  Patient works at [employer], states that he was in the back when he started to lose his balance.  Patient states that there was a crate of dog food behind him when she attempted to sit down.  The dog food slipped into the floor causing him to fall on his left shoulder.  Patient has a history of a TBI and has limited range of motion/weakness to the left side.  He was unable to catch himself and landed directly on his shoulder.  He did not hit his head or lose consciousness.  He was concerned as he had a similar injury a couple of months ago that resulted in a clavicle fracture.  Patient states that with his TBI he also has less pain than typically expected on the left and with the pain level he is concerned that he may have created a serious injury to his left shoulder.  He has decreased range of motion at baseline and as such is unable to tell whether he has had decreased range of motion.  He does have increased pain with range of motion however.     Physical Exam   Triage Vital Signs: ED Triage Vitals [03/25/21 2029]  Enc Vitals Group     BP (!) 162/73     Pulse Rate 71     Resp 19     Temp 97.6 F (36.4 C)     Temp Source Oral     SpO2 100 %     Weight 200 lb (90.7 kg)     Height 6\' 1"  (1.854 m)     Head Circumference      Peak Flow      Pain Score 10     Pain Loc      Pain Edu?      Excl. in GC?     Most recent vital signs: Vitals:   03/25/21 2029 03/25/21 2335  BP: (!) 162/73 (!) 154/76  Pulse: 71 80  Resp: 19 17  Temp: 97.6 F (36.4 C) 98 F (36.7 C)  SpO2: 100% 97%     General: Alert and in no acute distress. Head: No acute traumatic findings  Neck: No stridor. No cervical spine tenderness to palpation.  Cardiovascular:  Good  peripheral perfusion Respiratory: Normal respiratory effort without tachypnea or retractions. Lungs CTAB. Musculoskeletal: Full range of motion to all extremities.  Decreased range of motion which the patient states his baseline to the left upper extremity.  Patient also has decreased range of motion to the left lower extremity secondary to a TBI.  Tender to palpation over the proximal humerus specifically over the lateral humeral head.  No significant tenderness to the clavicle or scapula though he does have a palpable abnormality likely secondary to his previous and known left clavicle fracture. Neurologic:  No gross focal neurologic deficits are appreciated.  Skin:   No rash noted Other:   ED Results / Procedures / Treatments   Labs (all labs ordered are listed, but only abnormal results are displayed) Labs Reviewed - No data to display   EKG     RADIOLOGY  I personally viewed and evaluated these images as part of my medical decision making, as well as reviewing the written report by  the radiologist.  ED Provider Interpretation: Comminuted proximal left humeral fracture  DG Shoulder Left  Result Date: 03/25/2021 CLINICAL DATA:  Recent fall with left shoulder pain, initial encounter EXAM: LEFT SHOULDER - 2+ VIEW COMPARISON:  None. FINDINGS: There is comminuted fracture of the left humeral head involving primarily the anatomical neck some mild impaction is noted. No dislocation is seen. Old clavicular fracture is noted. Underlying bony thorax appears within normal limits. IMPRESSION: Comminuted proximal left humeral fracture. Electronically Signed   By: Alcide Clever M.D.   On: 03/25/2021 21:07    PROCEDURES:  Critical Care performed: No  Procedures   MEDICATIONS ORDERED IN ED: Medications  oxyCODONE-acetaminophen (PERCOCET/ROXICET) 5-325 MG per tablet 1 tablet (1 tablet Oral Given 03/25/21 2222)     IMPRESSION / MDM / ASSESSMENT AND PLAN / ED COURSE  I reviewed the triage  vital signs and the nursing notes.                              Differential diagnosis includes, but is not limited to, clavicle fracture, scapular fracture, rib fracture, humeral fracture, shoulder contusion   Patient's diagnosis is consistent with humeral fracture.  Patient presented to the emergency department with a proximal humeral fracture after a fall.  Patient has deficits to the left side secondary to a previous TBI.  He did not hit his head or lose consciousness.  He landed directly on his left shoulder.  Given the nature of the injury without protecting himself from the fall I was concerned for fracture and imaging was obtained.  This confirms firms fracture to the proximal humerus.  Patient is placed in a sling immobilizer.  He will be prescribed pain medication for symptom relief.  Follow-up with orthopedics for further management..  Patient is given ED precautions to return to the ED for any worsening or new symptoms.        FINAL CLINICAL IMPRESSION(S) / ED DIAGNOSES   Final diagnoses:  Closed fracture of head of left humerus, initial encounter     Rx / DC Orders   ED Discharge Orders          Ordered    oxyCODONE-acetaminophen (PERCOCET/ROXICET) 5-325 MG tablet  Every 6 hours PRN        03/25/21 2328             Note:  This document was prepared using Dragon voice recognition software and may include unintentional dictation errors.   Lanette Hampshire 03/25/21 2342    Georga Hacking, MD 03/25/21 2350

## 2021-03-25 NOTE — ED Notes (Addendum)
Shoulder Immobilizer applied to left arm

## 2021-03-25 NOTE — ED Notes (Addendum)
Patient is claiming worker's comp. Charge nurse April is aware that a urine drug screen is required and [facility] staff is needed to obtain urine drug screen. Percocet held at this time until after urine sample is obtained. Christiane Ha Cuthriell PA-C is aware.

## 2021-03-25 NOTE — ED Notes (Signed)
Workers comp information and paperwork completed. Urine is obtained by pt using proper procedure. Procedure is witnessed by April and Nilda Simmer RN. Urine specimen and all necessary paperwork is walked down to lab

## 2022-12-10 ENCOUNTER — Ambulatory Visit: Payer: Medicare PPO

## 2022-12-10 DIAGNOSIS — K5730 Diverticulosis of large intestine without perforation or abscess without bleeding: Secondary | ICD-10-CM | POA: Diagnosis not present

## 2022-12-10 DIAGNOSIS — K640 First degree hemorrhoids: Secondary | ICD-10-CM | POA: Diagnosis not present

## 2022-12-10 DIAGNOSIS — Z1211 Encounter for screening for malignant neoplasm of colon: Secondary | ICD-10-CM | POA: Diagnosis not present

## 2022-12-10 DIAGNOSIS — R195 Other fecal abnormalities: Secondary | ICD-10-CM | POA: Diagnosis not present

## 2023-01-21 ENCOUNTER — Other Ambulatory Visit: Payer: Self-pay | Admitting: Orthopedic Surgery

## 2023-01-21 DIAGNOSIS — S46001A Unspecified injury of muscle(s) and tendon(s) of the rotator cuff of right shoulder, initial encounter: Secondary | ICD-10-CM

## 2023-02-08 ENCOUNTER — Other Ambulatory Visit: Payer: Medicare PPO

## 2023-02-11 ENCOUNTER — Encounter: Payer: Self-pay | Admitting: Orthopedic Surgery

## 2023-02-14 ENCOUNTER — Ambulatory Visit
Admission: RE | Admit: 2023-02-14 | Discharge: 2023-02-14 | Disposition: A | Discharge status: Home / Self Care | Payer: Medicare PPO | Source: Ambulatory Visit | Attending: Orthopedic Surgery | Admitting: Orthopedic Surgery

## 2023-02-14 DIAGNOSIS — S46001A Unspecified injury of muscle(s) and tendon(s) of the rotator cuff of right shoulder, initial encounter: Secondary | ICD-10-CM

## 2023-09-03 ENCOUNTER — Other Ambulatory Visit: Payer: Self-pay | Admitting: Student

## 2023-09-03 ENCOUNTER — Ambulatory Visit
Admission: RE | Admit: 2023-09-03 | Discharge: 2023-09-03 | Disposition: A | Discharge status: Home / Self Care | Source: Ambulatory Visit | Attending: Student | Admitting: Student

## 2023-09-03 DIAGNOSIS — M79662 Pain in left lower leg: Secondary | ICD-10-CM | POA: Insufficient documentation

## 2023-11-26 ENCOUNTER — Other Ambulatory Visit: Payer: Self-pay

## 2023-11-26 ENCOUNTER — Emergency Department
Admission: EM | Admit: 2023-11-26 | Discharge: 2023-11-26 | Disposition: A | Discharge status: Home / Self Care | Attending: Emergency Medicine | Admitting: Emergency Medicine

## 2023-11-26 ENCOUNTER — Emergency Department

## 2023-11-26 DIAGNOSIS — S0990XA Unspecified injury of head, initial encounter: Secondary | ICD-10-CM | POA: Diagnosis present

## 2023-11-26 DIAGNOSIS — W19XXXA Unspecified fall, initial encounter: Secondary | ICD-10-CM

## 2023-11-26 DIAGNOSIS — W01198A Fall on same level from slipping, tripping and stumbling with subsequent striking against other object, initial encounter: Secondary | ICD-10-CM | POA: Insufficient documentation

## 2023-11-26 DIAGNOSIS — S0083XA Contusion of other part of head, initial encounter: Secondary | ICD-10-CM | POA: Diagnosis not present

## 2023-11-26 NOTE — ED Triage Notes (Signed)
 Patient ambulatory to triage with complaints of a fall tonight. Patient states he tripped and fell over a shoe in his bedroom, hit his knees, then fell and hit his head on the floor. Denies LOC. Patient endorses previous hx of brain aneurysm and wants to air on the side of caution and be checked out. Denies use of blood thinners.

## 2023-11-26 NOTE — Discharge Instructions (Signed)
 Fortunately there were no skull fractures or bleeding on your CT scan.  Take acetaminophen  650 mg and ibuprofen  400 mg every 6 hours for pain.  Take with food. Thank you for choosing us  for your health care today!  Please see your primary doctor this week for a follow up appointment.   If you have any new, worsening, or unexpected symptoms call your doctor right away or come back to the emergency department for reevaluation.  It was my pleasure to care for you today.   Ginnie EDISON Cyrena, MD

## 2023-11-26 NOTE — ED Provider Notes (Signed)
 [facility] Provider Note    Event Date/Time   First MD Initiated Contact with Patient 11/26/23 [phone]     (approximate)   History   Fall   HPI  Charles Bryan is a 58 y.o. male   Past medical history of brain aneurysm chronic left-sided deficits, here with a fall after he tripped over some sneakers on the ground and struck his forehead on the ground resulting in contusion to the left side of his forehead.  No loss of consciousness no blood thinners.  Fell on both knees, but was able to get up on his own and ambulate afterwards.  No other acute illnesses noted, no presyncopal symptoms leading to the fall, no loss of consciousness, and no other pain aside from the contusion on his head.    External Medical Documents Reviewed: Prior outpatient notes      Physical Exam   Triage Vital Signs: ED Triage Vitals [11/26/23 0414]  Encounter Vitals Group     BP (!) 153/72     Girls Systolic BP Percentile      Girls Diastolic BP Percentile      Boys Systolic BP Percentile      Boys Diastolic BP Percentile      Pulse Rate (!) 54     Resp 16     Temp (!) 97.5 F (36.4 C)     Temp Source Oral     SpO2 98 %     Weight 195 lb (88.5 kg)     Height 6' 1 (1.854 m)     Head Circumference      Peak Flow      Pain Score 5     Pain Loc      Pain Education      Exclude from Growth Chart     Most recent vital signs: Vitals:   11/26/23 0414 11/26/23 0518  BP: (!) 153/72   Pulse: (!) 54   Resp: 16   Temp: (!) 97.5 F (36.4 C)   SpO2: 98% 99%    General: Awake, no distress.  CV:  Good peripheral perfusion.  Resp:  Normal effort.  Abd:  No distention.  Other:  There is a hematoma to the left side of his forehead.  Extraocular movements intact, no proptosis, no C-spine tenderness or deformities, and palpation of the chest wall, abdomen, back, and bilateral upper and lower extremities elicits no bony tenderness or pain.  Able to range all extremities  without pain, though the left leg and arm movements are weaker compared to the right side with his chronic deficits.   ED Results / Procedures / Treatments   Labs (all labs ordered are listed, but only abnormal results are displayed) Labs Reviewed - No data to display   RADIOLOGY I independently reviewed and interpreted CT of the head and see no acute bleeding or midline shift I also reviewed radiologist's formal read.   PROCEDURES:  Critical Care performed: No  Procedures   MEDICATIONS ORDERED IN ED: Medications - No data to display   IMPRESSION / MDM / ASSESSMENT AND PLAN / ED COURSE  I reviewed the triage vital signs and the nursing notes.                                Patient's presentation is most consistent with acute presentation with potential threat to life or bodily function.  Differential diagnosis includes,  but is not limited to, head injury skull fracture ICH C-spine injury, mechanical fall versus syncope   The patient is on the cardiac monitor to evaluate for evidence of arrhythmia and/or significant heart rate changes.  MDM:    Mechanical fall as described by patient without any presyncopal symptoms or any other associated medical complaints, traumatic injuries noted to the forehead with a noted hematoma, but fortunately CT scan of the head and neck showed no acute traumatic life-threatening injuries.  Head to toe examination without any significant injuries.  Patient awake alert oriented, and appropriate for discharge home with anticipatory guidance for his head injury.  I considered hospitalization for admission or observation given unremarkable workup as above, ambulatory status, I see no indication for hospitalization at this point and plan will be for discharge with PMD follow-up       FINAL CLINICAL IMPRESSION(S) / ED DIAGNOSES   Final diagnoses:  Fall, initial encounter  Injury of head, initial encounter     Rx / DC Orders   ED  Discharge Orders     None        Note:  This document was prepared using Dragon voice recognition software and may include unintentional dictation errors.    Cyrena Mylar, MD 11/26/23 [phone]

## 2024-01-18 ENCOUNTER — Other Ambulatory Visit: Payer: Self-pay

## 2024-01-18 ENCOUNTER — Encounter: Payer: Self-pay | Admitting: Emergency Medicine

## 2024-01-18 DIAGNOSIS — W01198A Fall on same level from slipping, tripping and stumbling with subsequent striking against other object, initial encounter: Secondary | ICD-10-CM | POA: Insufficient documentation

## 2024-01-18 NOTE — ED Triage Notes (Signed)
 To ER after a trip and fall on laminate floor. Denies LOC, no thinners, no pain/headache.   History of brain aneurysm in the 90s, reports no more aneurysms, they have been removed. Just wants to get checked out due to history of aneurysm.

## 2024-01-19 ENCOUNTER — Emergency Department

## 2024-01-19 ENCOUNTER — Emergency Department
Admission: EM | Admit: 2024-01-19 | Discharge: 2024-01-19 | Disposition: A | Discharge status: Home / Self Care | Attending: Emergency Medicine | Admitting: Emergency Medicine

## 2024-01-19 DIAGNOSIS — S0990XA Unspecified injury of head, initial encounter: Secondary | ICD-10-CM | POA: Insufficient documentation

## 2024-01-19 DIAGNOSIS — W19XXXA Unspecified fall, initial encounter: Secondary | ICD-10-CM

## 2024-01-19 NOTE — ED Provider Notes (Signed)
 [facility] Provider Note    Event Date/Time   First MD Initiated Contact with Patient 01/19/24 [phone]     (approximate)   History   Head Injury   HPI  Charles Bryan is a 58 y.o. male who presents to the ED for evaluation of Head Injury   Reviewed PCP visit from last month.  History of intracranial AVM rupture in 1995.   Patient presents to the ED for the ration of an accidental fall and head injury.  Reports he slipped on some water spilled on his vinyl flooring causing him to fall and struck the left side of his forehead.  No syncope.  Reports he feels fine right now and has no pain.  Reports 1 to make sure his brain is okay because of his history of intracranial hemorrhage.   Physical Exam   Triage Vital Signs: ED Triage Vitals  Encounter Vitals Group     BP 01/18/24 2304 (!) 154/73     Girls Systolic BP Percentile --      Girls Diastolic BP Percentile --      Boys Systolic BP Percentile --      Boys Diastolic BP Percentile --      Pulse Rate 01/18/24 2304 (!) 57     Resp 01/18/24 2304 17     Temp 01/18/24 2304 (!) 97.5 F (36.4 C)     Temp Source 01/18/24 2304 Oral     SpO2 01/18/24 2304 97 %     Weight 01/18/24 2302 195 lb (88.5 kg)     Height 01/18/24 2302 6' 1 (1.854 m)     Head Circumference --      Peak Flow --      Pain Score 01/18/24 2302 0     Pain Loc --      Pain Education --      Exclude from Growth Chart --     Most recent vital signs: Vitals:   01/19/24 0258 01/19/24 0259  BP:  (!) 152/79  Pulse:  (!) 51  Resp:  18  Temp:  98 F (36.7 C)  SpO2: 97% 98%    General: Awake, no distress.  Superficial abrasion to the left sided forehead without discrete laceration, hematoma or more significant trauma.  No proptosis or EOM entrapment CV:  Good peripheral perfusion.  Resp:  Normal effort.  Abd:  No distention.  MSK:  No deformity noted.  Neuro:   Chronic left-sided contractures and weakness without evidence of  acute deficits Other:     ED Results / Procedures / Treatments   Labs (all labs ordered are listed, but only abnormal results are displayed) Labs Reviewed - No data to display  EKG   RADIOLOGY CT head interpreted by me without evidence of acute intracranial pathology  Official radiology report(s): CT HEAD WO CONTRAST ( ) Result Date: 01/19/2024 EXAM: CT HEAD WITHOUT 01/19/2024 03:23:57 AM TECHNIQUE: CT of the head was performed without the administration of intravenous contrast. Automated exposure control, iterative reconstruction, and/or weight based adjustment of the mA/kV was utilized to reduce the radiation dose to as low as reasonably achievable. COMPARISON: 11/26/2023 CLINICAL HISTORY: Fall, head injury. History of intracranial AVM rupture remotely and left hand weakness. FINDINGS: BRAIN AND VENTRICLES: No acute intracranial hemorrhage. No mass effect or midline shift. No extra-axial fluid collection. No evidence of acute infarct. No hydrocephalus. Unchanged appearance of large area of encephalomalacia within the right hemisphere predominantly affecting the middle cerebral artery territory.  ORBITS: No acute abnormality. SINUSES AND MASTOIDS: No acute abnormality. SOFT TISSUES AND SKULL: No acute skull fracture. No acute soft tissue abnormality. Remote right-sided craniotomy. IMPRESSION: 1. No acute intracranial abnormality. 2. 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 Electronically signed by: Franky Stanford MD 01/19/2024 03:38 AM EST RP Workstation: [id number]    PROCEDURES and INTERVENTIONS:  Procedures  Medications - No data to display   IMPRESSION / MDM / ASSESSMENT AND PLAN / ED COURSE  I reviewed the triage vital signs and the nursing notes.  Differential diagnosis includes, but is not limited to, ICH, skull fracture, concussion, syncope, seizure  Patient presents after minor  mechanical fall with reassuring exam, workup and suitable for outpatient management.  No indications for additional imaging at this point      FINAL CLINICAL IMPRESSION(S) / ED DIAGNOSES   Final diagnoses:  Fall, initial encounter  Closed head injury, initial encounter     Rx / DC Orders   ED Discharge Orders     None        Note:  This document was prepared using Dragon voice recognition software and may include unintentional dictation errors.   Claudene Rover, MD 01/19/24 [phone]
